# Patient Record
Sex: Male | Born: 1985 | Race: Black or African American | Hispanic: No | Marital: Married | Smoking: Current every day smoker
Health system: Southern US, Community
[De-identification: ages and names within clinical notes are randomized; demographics above are authoritative.]

---

## 2013-07-15 ENCOUNTER — Emergency Department (HOSPITAL_COMMUNITY): Payer: Medicaid Other

## 2013-07-15 ENCOUNTER — Encounter (HOSPITAL_COMMUNITY): Payer: Self-pay | Admitting: Emergency Medicine

## 2013-07-15 ENCOUNTER — Emergency Department (HOSPITAL_COMMUNITY)
Admission: EM | Admit: 2013-07-15 | Discharge: 2013-07-15 | Disposition: A | Discharge status: Home / Self Care | Payer: Medicaid Other | Attending: Emergency Medicine | Admitting: Emergency Medicine

## 2013-07-15 DIAGNOSIS — R002 Palpitations: Secondary | ICD-10-CM | POA: Diagnosis not present

## 2013-07-15 DIAGNOSIS — R001 Bradycardia, unspecified: Secondary | ICD-10-CM

## 2013-07-15 DIAGNOSIS — R42 Dizziness and giddiness: Secondary | ICD-10-CM | POA: Insufficient documentation

## 2013-07-15 DIAGNOSIS — R0789 Other chest pain: Secondary | ICD-10-CM

## 2013-07-15 DIAGNOSIS — I498 Other specified cardiac arrhythmias: Secondary | ICD-10-CM | POA: Insufficient documentation

## 2013-07-15 DIAGNOSIS — R059 Cough, unspecified: Secondary | ICD-10-CM | POA: Diagnosis not present

## 2013-07-15 DIAGNOSIS — R05 Cough: Secondary | ICD-10-CM | POA: Diagnosis not present

## 2013-07-15 DIAGNOSIS — R079 Chest pain, unspecified: Secondary | ICD-10-CM | POA: Diagnosis present

## 2013-07-15 DIAGNOSIS — F17200 Nicotine dependence, unspecified, uncomplicated: Secondary | ICD-10-CM | POA: Insufficient documentation

## 2013-07-15 DIAGNOSIS — R042 Hemoptysis: Secondary | ICD-10-CM | POA: Insufficient documentation

## 2013-07-15 LAB — BASIC METABOLIC PANEL
BUN: 10 mg/dL (ref 6–23)
CO2: 27 meq/L (ref 19–32)
Calcium: 9.5 mg/dL (ref 8.4–10.5)
Chloride: 100 mEq/L (ref 96–112)
Creatinine, Ser: 0.84 mg/dL (ref 0.50–1.35)
GFR calc Af Amer: >90 mL/min (ref >90)
GFR calc non Af Amer: >90 mL/min (ref >90)
Glucose, Bld: 99 mg/dL (ref 70–99)
Potassium: 3.8 mEq/L (ref 3.7–5.3)
SODIUM: 140 meq/L (ref 137–147)

## 2013-07-15 LAB — CBC
HCT: 38.6 % — ABNORMAL LOW (ref 39.0–52.0)
Hemoglobin: 13.6 g/dL (ref 13.0–17.0)
MCH: 30 pg (ref 26.0–34.0)
MCHC: 35.2 g/dL (ref 30.0–36.0)
MCV: 85.2 fL (ref 78.0–100.0)
PLATELETS: 257 10*3/uL (ref 150–400)
RBC: 4.53 MIL/uL (ref 4.22–5.81)
RDW: 12.9 % (ref 11.5–15.5)
WBC: 4.3 10*3/uL (ref 4.0–10.5)

## 2013-07-15 LAB — I-STAT TROPONIN, ED: TROPONIN I, POC: 0 ng/mL (ref 0.00–0.08)

## 2013-07-15 MED ORDER — NAPROXEN 500 MG PO TABS: 500.0000 mg | ORAL_TABLET | Freq: Two times a day (BID) | ORAL | Status: DC

## 2013-07-15 MED ORDER — AZITHROMYCIN 250 MG PO TABS: 250.0000 mg | ORAL_TABLET | Freq: Once | ORAL | Status: DC

## 2013-07-15 NOTE — ED Notes (Addendum)
Pt states he had two cups of coffee today. States for about a week his right side of chest will feel like it is racing and vibrating. Off and on. Chest pain 0/10. Denies pain. No acute distress noted. Sinus brady at 43 bpm. Pt was dizzy but is not at present.

## 2013-07-15 NOTE — ED Provider Notes (Signed)
CSN: [id number]     Arrival date & time 07/15/13  1939 History   First MD Initiated Contact with Patient 07/15/13 2107     Chief Complaint  Patient presents with  . Chest Pain   HPI  History provided by the patient and family. Patient is a healthy 28 year old male with no significant PMH presenting with symptoms of right chest discomfort, cough and slight lightheadedness. Patient states that he has had a long history since he was a young child of occasional heart palpitations and unusual fluttering in discomforts in his chest. He has never had any issues from this. He is never been evaluated for these symptoms. Yesterday he reports having some pains to his right chest area which lasted several hours. They did resolve on their own without any treatment. He does also report an associated increased productive cough for the past several weeks. He and the family report having issues with mold in the house which had taken out. Since that time he has had a productive cough occasionally with small streaks of blood. He states he has been using a lot of organic Garlick in his foods which has seemed to help reduce the amount of productive cough. He denies having any associated fever or chills with this cough. Coughing does sometimes make the right chest more uncomfortable. Today he also felt slightly lightheaded. Denies any other aggravating or alleviating factors. No other associated symptoms. No recent long travel. No pain or swelling in the extremities. No prior history of DVT or PE.     History reviewed. No pertinent past medical history. History reviewed. No pertinent past surgical history. History reviewed. No pertinent family history. History  Substance Use Topics  . Smoking status: Current Every Day Smoker    Types: Cigarettes  . Smokeless tobacco: Not on file  . Alcohol Use: Yes    Review of Systems  Constitutional: Negative for fever, chills and diaphoresis.  Respiratory: Positive for cough.    Cardiovascular: Positive for palpitations. Negative for chest pain and leg swelling.  Gastrointestinal: Negative for nausea, vomiting and abdominal pain.  Neurological: Positive for light-headedness. Negative for weakness, numbness and headaches.  All other systems reviewed and are negative.     Allergies  Review of patient's allergies indicates no known allergies.  Home Medications   Prior to Admission medications   Not on File   BP 137/64  Pulse 49  Temp(Src) 98.2 F (36.8 C) (Oral)  Resp 18  SpO2 100% Physical Exam  Nursing note and vitals reviewed. Constitutional: He is oriented to person, place, and time. He appears well-developed and well-nourished. No distress.  HENT:  Head: Normocephalic.  Mouth/Throat: Oropharynx is clear and moist.  Cardiovascular: Regular rhythm.  Bradycardia present.   No murmur heard. Pulmonary/Chest: Effort normal and breath sounds normal. No respiratory distress. He has no wheezes. He has no rales.  Abdominal: Soft. There is no tenderness. There is no rebound and no guarding.  Musculoskeletal: Normal range of motion. He exhibits no edema and no tenderness.  No clinical signs concerning for DVT  Neurological: He is alert and oriented to person, place, and time.  Skin: Skin is warm.  Psychiatric: He has a normal mood and affect. His behavior is normal.    ED Course  Procedures   COORDINATION OF CARE:  Nursing notes reviewed. Vital signs reviewed. Initial pt interview and examination performed.   Filed Vitals:   07/15/13 1951  BP: 137/64  Pulse: 49  Temp: 98.2 F (36.8 C)  TempSrc: Oral  Resp: 18  SpO2: 100%    9:39 PM-patient seen and evaluated. Patient resting appears well in no acute distress. Denies any complaints at this time. Does report long history of similar occasional funny sensation in heartbeats. Does have sinus bradycardia without any other significant findings. Laboratory testing unremarkable. There was slight  concerns for possible airway infection. Patient does report a history of recent mold problems in his house. Has had improvements of productive cough. Patient does not have any significant risk factors for ACS.  At this time I doubt any ACS. Doubt PE. Given his bradycardia symptoms we'll plan to give cardiology referral for further evaluation. We'll also plan to give Z-Pak for questionable old airway infection. Strictures from precautions given. Patient agrees with plan.  Results for orders placed during the hospital encounter of 07/15/13  CBC      Result Value Ref Range   WBC 4.3  4.0 - 10.5 K/uL   RBC 4.53  4.22 - 5.81 MIL/uL   Hemoglobin 13.6  13.0 - 17.0 g/dL   HCT 40.938.6 (*) 81.139.0 - 91.452.0 %   MCV 85.2  78.0 - 100.0 fL   MCH 30.0  26.0 - 34.0 pg   MCHC 35.2  30.0 - 36.0 g/dL   RDW 78.212.9  95.611.5 - 21.315.5 %   Platelets 257  150 - 400 K/uL  BASIC METABOLIC PANEL      Result Value Ref Range   Sodium 140  137 - 147 mEq/L   Potassium 3.8  3.7 - 5.3 mEq/L   Chloride 100  96 - 112 mEq/L   CO2 27  19 - 32 mEq/L   Glucose, Bld 99  70 - 99 mg/dL   BUN 10  6 - 23 mg/dL   Creatinine, Ser 0.860.84  0.50 - 1.35 mg/dL   Calcium 9.5  8.4 - 57.810.5 mg/dL   GFR calc non Af Amer >90  >90 mL/min   GFR calc Af Amer >90  >90 mL/min  I-STAT TROPOININ, ED      Result Value Ref Range   Troponin i, poc 0.00  0.00 - 0.08 ng/mL   Comment 3              Imaging Review Dg Chest 2 View  07/15/2013   CLINICAL DATA:  28 year old male with right upper chest pain, dizziness, spasm. Brown sputum. Initial encounter.  EXAM: CHEST  2 VIEW  COMPARISON:  None.  FINDINGS: Normal lung volumes. Normal cardiac size and mediastinal contours. Visualized tracheal air column is within normal limits. No pneumothorax or pleural effusion. No pulmonary edema or confluent pulmonary opacity. Mild upper lobe predominant increased interstitial markings. No acute osseous abnormality identified.  IMPRESSION: Negative except for mild upper lobe  predominant increased interstitial markings. Acute viral/ atypical respiratory infection not excluded.   Electronically Signed   By: Augusto GambleLee  Hall M.D.   On: 07/15/2013 20:46    Date: 07/15/2013  Rate: 52  Rhythm: sinus bradycardia and sinus arrhythmia  QRS Axis: normal  Intervals: normal  ST/T Wave abnormalities: nonspecific ST changes  Conduction Disutrbances:none  Narrative Interpretation:   Old EKG Reviewed: none available      MDM   Final diagnoses:  Cough  Atypical chest pain  Bradycardia         Angus Sellereter S Dammen, PA-C 07/15/13 2150

## 2013-07-15 NOTE — ED Notes (Signed)
Presents with right sided chest pain that began last night associate with dizziness. Reports one week of a funny feeling in right side of chest "my heart will speed up real fast and then slow down and it feels funny." chest pain radiates from right side into center of chest associateed ewith dizziness. Pain comes and goes. Was so severe yesterday he had to leave work.

## 2013-07-15 NOTE — Discharge Instructions (Signed)
Please followup with a primary care provider and a cardiology specialist for continued evaluation and treatment of your symptoms and to evaluate your slow heart beat. Return at any time for changing or worsening symptoms.    Bradycardia Bradycardia is a term for a heart rate (pulse) that, in adults, is slower than 60 beats per minute. A normal rate is 60 to 100 beats per minute. A heart rate below 60 beats per minute may be normal for some adults with healthy hearts. If the rate is too slow, the heart may have trouble pumping the volume of blood the body needs. If the heart rate gets too low, blood flow to the brain may be decreased and may make you feel lightheaded, dizzy, or faint. The heart has a natural pacemaker in the top of the heart called the SA node (sinoatrial or sinus node). This pacemaker sends out regular electrical signals to the muscle of the heart, telling the heart muscle when to beat (contract). The electrical signal travels from the upper parts of the heart (atria) through the AV node (atrioventricular node), to the lower chambers of the heart (ventricles). The ventricles squeeze, pumping the blood from your heart to your lungs and to the rest of your body. CAUSES   Problem with the heart's electrical system.  Problem with the heart's natural pacemaker.  Heart disease, damage, or infection.  Medications.  Problems with minerals and salts (electrolytes). SYMPTOMS   Fainting (syncope).  Fatigue and weakness.  Shortness of breath (dyspnea).  Chest pain (angina).  Drowsiness.  Confusion. DIAGNOSIS   An electrocardiogram (ECG) can help your caregiver determine the type of slow heart rate you have.  If the cause is not seen on an ECG, you may need to wear a heart monitor that records your heart rhythm for several hours or days.  Blood tests. TREATMENT   Electrolyte supplements.  Medications.  Withholding medication which is causing a slow heart  rate.  Pacemaker placement. SEEK IMMEDIATE MEDICAL CARE IF:   You feel lightheaded or faint.  You develop an irregular heart rate.  You feel chest pain or have trouble breathing. MAKE SURE YOU:   Understand these instructions.  Will watch your condition.  Will get help right away if you are not doing well or get worse. Document Released: 09/28/2001 Document Revised: 03/31/2011 Document Reviewed: 08/25/2007 [facility] Patient Information 2015 [place], [place]. This information is not intended to replace advice given to you by your health care provider. Make sure you discuss any questions you have with your health care provider.    Cough, Adult  A cough is a reflex. It helps you clear your throat and airways. A cough can help heal your body. A cough can last 2 or 3 weeks (acute) or may last more than 8 weeks (chronic). Some common causes of a cough can include an infection, allergy, or a cold. HOME CARE  Only take medicine as told by your doctor.  If given, take your medicines (antibiotics) as told. Finish them even if you start to feel better.  Use a cold steam vaporizer or humidier in your home. This can help loosen thick spit (secretions).  Sleep so you are almost sitting up (semi-upright). Use pillows to do this. This helps reduce coughing.  Rest as needed.  Stop smoking if you smoke. GET HELP RIGHT AWAY IF:  You have yellowish-white fluid (pus) in your thick spit.  Your cough gets worse.  Your medicine does not reduce coughing, and you are  losing sleep.  You cough up blood.  You have trouble breathing.  Your pain gets worse and medicine does not help.  You have a fever. MAKE SURE YOU:   Understand these instructions.  Will watch your condition.  Will get help right away if you are not doing well or get worse. Document Released: 09/19/2010 Document Revised: 03/31/2011 Document Reviewed: 09/19/2010 [facility] Patient Information 2015 [place], [place]. This  information is not intended to replace advice given to you by your health care provider. Make sure you discuss any questions you have with your health care provider.

## 2013-07-16 NOTE — ED Provider Notes (Signed)
Medical screening examination/treatment/procedure(s) were performed by non-physician practitioner and as supervising physician I was immediately available for consultation/collaboration.   EKG Interpretation None        David H Yao, MD 07/16/13 1121 

## 2018-07-30 ENCOUNTER — Other Ambulatory Visit: Payer: Self-pay

## 2018-07-30 ENCOUNTER — Ambulatory Visit (HOSPITAL_COMMUNITY)
Admission: EM | Admit: 2018-07-30 | Discharge: 2018-07-30 | Disposition: A | Discharge status: Home / Self Care | Payer: PRIVATE HEALTH INSURANCE | Attending: Family Medicine | Admitting: Family Medicine

## 2018-07-30 ENCOUNTER — Encounter (HOSPITAL_COMMUNITY): Payer: Self-pay

## 2018-07-30 DIAGNOSIS — M25512 Pain in left shoulder: Secondary | ICD-10-CM

## 2018-07-30 MED ORDER — NAPROXEN 500 MG PO TABS: 500.0000 mg | ORAL_TABLET | Freq: Two times a day (BID) | ORAL | 0 refills | Status: AC

## 2018-07-30 MED ORDER — TIZANIDINE HCL 4 MG PO TABS: 4.0000 mg | ORAL_TABLET | Freq: Four times a day (QID) | ORAL | 0 refills | Status: AC | PRN

## 2018-07-30 NOTE — ED Provider Notes (Signed)
[facility]    CSN: [id number] Arrival date & time: 07/30/18  9379      History   Chief Complaint Chief Complaint  Patient presents with  . Shoulder Pain    HPI Zachary Mcgrath is a 33 y.o. male.   HPI  Patient states that he woke up yesterday with a pain in his back.  He thinks he slept wrong.  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  Today he woke up and felt he was not able to work.  The pain is in the neck on the left side, and up by the shoulder blade.  No pain in the shoulder joint.  No radiation of the arms, no numbness or weakness.  No injury.  No trauma.  No fall.  No prior neck problems. Patient states he took Naprosyn for prior orthopedic problem that worked well for him.  Is requesting Naprosyn  History reviewed. No pertinent past medical history.  There are no active problems to display for this patient.   History reviewed. No pertinent surgical history.     Home Medications    Prior to Admission medications   Medication Sig Start Date End Date Taking? Authorizing Provider  naproxen (NAPROSYN) 500 MG tablet Take 1 tablet (500 mg total) by mouth 2 (two) times daily with a meal. 07/30/18   Raylene Everts, MD  tiZANidine (ZANAFLEX) 4 MG tablet Take 1-2 tablets (4-8 mg total) by mouth every 6 (six) hours as needed for muscle spasms. 07/30/18   Raylene Everts, MD    Family History Family History  Family history unknown: Yes    Social History Social History   Tobacco Use  . Smoking status: Current Every Day Smoker    Types: Cigarettes  Substance Use Topics  . Alcohol use: Yes  . Drug use: No     Allergies   Patient has no known allergies.   Review of Systems Review of Systems  Constitutional: Negative for chills and fever.  HENT: Negative for ear pain and sore throat.   Eyes: Negative for pain and visual disturbance.  Respiratory: Negative for cough and shortness of breath.    Cardiovascular: Negative for chest pain and palpitations.  Gastrointestinal: Negative for abdominal pain and vomiting.  Genitourinary: Negative for dysuria and hematuria.  Musculoskeletal: Positive for neck pain and neck stiffness. Negative for arthralgias and back pain.  Skin: Negative for color change and rash.  Neurological: Negative for seizures and syncope.  All other systems reviewed and are negative.    Physical Exam Triage Vital Signs ED Triage Vitals  Enc Vitals Group     BP 07/30/18 0905 (!) 157/84     Pulse Rate 07/30/18 0905 (!) 58     Resp 07/30/18 0905 18     Temp 07/30/18 0905 99.6 F (37.6 C)     Temp Source 07/30/18 0905 Oral     SpO2 07/30/18 0905 100 %     Weight --      Height --      Head Circumference --      Peak Flow --      Pain Score 07/30/18 0906 9     Pain Loc --      Pain Edu? --      Excl. in [place]? --    No data found.  Updated Vital Signs BP (!) 157/84 (BP Location: Left Arm)   Pulse (!) 58  Temp 99.6 F (37.6 C) (Oral)   Resp 18   SpO2 100%   Visual Acuity Right Eye Distance:   Left Eye Distance:   Bilateral Distance:    Right Eye Near:   Left Eye Near:    Bilateral Near:     Physical Exam Constitutional:      General: He is not in acute distress.    Appearance: He is well-developed.  HENT:     Head: Normocephalic and atraumatic.  Eyes:     Conjunctiva/sclera: Conjunctivae normal.     Pupils: Pupils are equal, round, and reactive to light.  Neck:     Musculoskeletal: Normal range of motion.  Cardiovascular:     Rate and Rhythm: Normal rate.  Pulmonary:     Effort: Pulmonary effort is normal. No respiratory distress.  Abdominal:     General: There is no distension.     Palpations: Abdomen is soft.  Musculoskeletal: Normal range of motion.     Comments: Tenderness in the left upper body trapezius down the medial border of the scapula.  Pain with neck range of motion and shoulder shrug.  Skin:    General: Skin is warm  and dry.  Neurological:     General: No focal deficit present.     Mental Status: He is alert.     Sensory: No sensory deficit.     Motor: No weakness.     Coordination: Coordination normal.     Gait: Gait normal.     Deep Tendon Reflexes: Reflexes normal.  Psychiatric:        Mood and Affect: Mood normal.      UC Treatments / Results  Labs (all labs ordered are listed, but only abnormal results are displayed) Labs Reviewed - No data to display  EKG   Radiology No results found.  Procedures Procedures (including critical care time)  Medications Ordered in UC Medications - No data to display  Initial Impression / Assessment and Plan / UC Course  I have reviewed the triage vital signs and the nursing notes.  Pertinent labs & imaging results that were available during my care of the patient were reviewed by me and considered in my medical decision making (see chart for details).     Muscular neck pain.  Discussed Final Clinical Impressions(s) / UC Diagnoses   Final diagnoses:  Acute pain of left shoulder     Discharge Instructions     Ice or heat to painful muscles Rest Take medication as prescribed Call if not improving in a few days   ED Prescriptions    Medication Sig Dispense Auth. Provider   naproxen (NAPROSYN) 500 MG tablet Take 1 tablet (500 mg total) by mouth 2 (two) times daily with a meal. 30 tablet Eustace MooreNelson, Cia Garretson Sue, MD   tiZANidine (ZANAFLEX) 4 MG tablet Take 1-2 tablets (4-8 mg total) by mouth every 6 (six) hours as needed for muscle spasms. 21 tablet Eustace MooreNelson, Carlesha Seiple Sue, MD     Controlled Substance Prescriptions [place] Controlled Substance Registry consulted? Not Applicable   Eustace MooreNelson, Kelley Polinsky Sue, MD 07/30/18 1023

## 2018-07-30 NOTE — ED Triage Notes (Signed)
Pt presents with left shoulder and upper back pain by shoulder X 2 days.

## 2018-07-30 NOTE — Discharge Instructions (Signed)
Ice or heat to painful muscles Rest Take medication as prescribed Call if not improving in a few days

## 2018-12-15 ENCOUNTER — Inpatient Hospital Stay (HOSPITAL_COMMUNITY)
Admission: EM | Admit: 2018-12-15 | Discharge: 2018-12-16 | DRG: 343 | Disposition: A | Discharge status: Home / Self Care | Payer: PRIVATE HEALTH INSURANCE | Attending: Surgery | Admitting: Surgery

## 2018-12-15 ENCOUNTER — Other Ambulatory Visit: Payer: Self-pay

## 2018-12-15 ENCOUNTER — Ambulatory Visit (HOSPITAL_COMMUNITY)
Admission: EM | Admit: 2018-12-15 | Discharge: 2018-12-15 | Disposition: A | Discharge status: Home / Self Care | Payer: PRIVATE HEALTH INSURANCE | Source: Home / Self Care

## 2018-12-15 ENCOUNTER — Emergency Department (HOSPITAL_COMMUNITY): Payer: PRIVATE HEALTH INSURANCE

## 2018-12-15 ENCOUNTER — Encounter (HOSPITAL_COMMUNITY): Payer: Self-pay | Admitting: Emergency Medicine

## 2018-12-15 ENCOUNTER — Encounter (HOSPITAL_COMMUNITY): Payer: Self-pay

## 2018-12-15 DIAGNOSIS — Z20828 Contact with and (suspected) exposure to other viral communicable diseases: Secondary | ICD-10-CM | POA: Diagnosis present

## 2018-12-15 DIAGNOSIS — Z79899 Other long term (current) drug therapy: Secondary | ICD-10-CM | POA: Diagnosis not present

## 2018-12-15 DIAGNOSIS — K3580 Unspecified acute appendicitis: Principal | ICD-10-CM | POA: Diagnosis present

## 2018-12-15 DIAGNOSIS — F17210 Nicotine dependence, cigarettes, uncomplicated: Secondary | ICD-10-CM | POA: Diagnosis present

## 2018-12-15 LAB — URINALYSIS, ROUTINE W REFLEX MICROSCOPIC
Bilirubin Urine: NEGATIVE
Glucose, UA: NEGATIVE mg/dL
Hgb urine dipstick: NEGATIVE
Ketones, ur: NEGATIVE mg/dL
Leukocytes,Ua: NEGATIVE
Nitrite: NEGATIVE
Protein, ur: NEGATIVE mg/dL
Specific Gravity, Urine: 1.014 (ref 1.005–1.030)
pH: 6 (ref 5.0–8.0)

## 2018-12-15 LAB — COMPREHENSIVE METABOLIC PANEL
ALT: 30 U/L (ref 0–44)
AST: 39 U/L (ref 15–41)
Albumin: 4.8 g/dL (ref 3.5–5.0)
Alkaline Phosphatase: 62 U/L (ref 38–126)
Anion gap: 14 (ref 5–15)
BUN: 9 mg/dL (ref 6–20)
CO2: 22 mmol/L (ref 22–32)
Calcium: 9.6 mg/dL (ref 8.9–10.3)
Chloride: 101 mmol/L (ref 98–111)
Creatinine, Ser: 1 mg/dL (ref 0.61–1.24)
GFR calc Af Amer: >60 mL/min (ref >60)
GFR calc non Af Amer: >60 mL/min (ref >60)
Glucose, Bld: 94 mg/dL (ref 70–99)
Potassium: 4.2 mmol/L (ref 3.5–5.1)
Sodium: 137 mmol/L (ref 135–145)
Total Bilirubin: 1.7 mg/dL — ABNORMAL HIGH (ref 0.3–1.2)
Total Protein: 8.1 g/dL (ref 6.5–8.1)

## 2018-12-15 LAB — CBC
HCT: 42.5 % (ref 39.0–52.0)
Hemoglobin: 14.7 g/dL (ref 13.0–17.0)
MCH: 29.6 pg (ref 26.0–34.0)
MCHC: 34.6 g/dL (ref 30.0–36.0)
MCV: 85.5 fL (ref 80.0–100.0)
Platelets: 261 10*3/uL (ref 150–400)
RBC: 4.97 MIL/uL (ref 4.22–5.81)
RDW: 12.8 % (ref 11.5–15.5)
WBC: 9.4 10*3/uL (ref 4.0–10.5)
nRBC: 0 % (ref 0.0–0.2)

## 2018-12-15 LAB — LIPASE, BLOOD: Lipase: 28 U/L (ref 11–51)

## 2018-12-15 LAB — SARS CORONAVIRUS 2 BY RT PCR (HOSPITAL ORDER, PERFORMED IN ~~LOC~~ HOSPITAL LAB): SARS Coronavirus 2: NEGATIVE

## 2018-12-15 MED ORDER — ONDANSETRON 4 MG PO TBDP: 4.0000 mg | ORAL_TABLET | Freq: Four times a day (QID) | ORAL | Status: DC | PRN

## 2018-12-15 MED ORDER — SODIUM CHLORIDE 0.9 % IV SOLN
2.0000 g | Freq: Once | INTRAVENOUS | Status: AC
  Administered 2018-12-15: 2 g via INTRAVENOUS
  Filled 2018-12-15: qty 20

## 2018-12-15 MED ORDER — HYDROMORPHONE HCL 1 MG/ML IJ SOLN: 0.5000 mg | INTRAMUSCULAR | Status: DC | PRN

## 2018-12-15 MED ORDER — ONDANSETRON HCL 4 MG/2ML IJ SOLN: 4.0000 mg | Freq: Four times a day (QID) | INTRAMUSCULAR | Status: DC | PRN

## 2018-12-15 MED ORDER — IOHEXOL 300 MG/ML  SOLN
100.0000 mL | Freq: Once | INTRAMUSCULAR | Status: AC | PRN
  Administered 2018-12-15: 19:00:00 100 mL via INTRAVENOUS

## 2018-12-15 MED ORDER — PIPERACILLIN-TAZOBACTAM 3.375 G IVPB
3.3750 g | Freq: Three times a day (TID) | INTRAVENOUS | Status: DC
  Administered 2018-12-16 (×2): 3.375 g via INTRAVENOUS
  Filled 2018-12-15 (×3): qty 50

## 2018-12-15 MED ORDER — TRAMADOL HCL 50 MG PO TABS: 50.0000 mg | ORAL_TABLET | Freq: Four times a day (QID) | ORAL | Status: DC | PRN

## 2018-12-15 MED ORDER — SODIUM CHLORIDE 0.9% FLUSH: 3.0000 mL | Freq: Once | INTRAVENOUS | Status: DC

## 2018-12-15 MED ORDER — SODIUM CHLORIDE 0.9 % IV SOLN
INTRAVENOUS | Status: DC | PRN
  Administered 2018-12-15: 21:00:00 via INTRAVENOUS

## 2018-12-15 MED ORDER — METRONIDAZOLE IN NACL 5-0.79 MG/ML-% IV SOLN
500.0000 mg | Freq: Once | INTRAVENOUS | Status: AC
  Administered 2018-12-15: 500 mg via INTRAVENOUS
  Filled 2018-12-15: qty 100

## 2018-12-15 MED ORDER — DIPHENHYDRAMINE HCL 12.5 MG/5ML PO ELIX: 12.5000 mg | ORAL_SOLUTION | Freq: Four times a day (QID) | ORAL | Status: DC | PRN

## 2018-12-15 MED ORDER — ACETAMINOPHEN 500 MG PO TABS
1000.0000 mg | ORAL_TABLET | Freq: Four times a day (QID) | ORAL | Status: DC
  Administered 2018-12-16 (×2): 1000 mg via ORAL
  Filled 2018-12-15 (×2): qty 2

## 2018-12-15 MED ORDER — DOCUSATE SODIUM 100 MG PO CAPS
200.0000 mg | ORAL_CAPSULE | Freq: Two times a day (BID) | ORAL | Status: DC
  Administered 2018-12-16: 200 mg via ORAL
  Filled 2018-12-15 (×2): qty 2

## 2018-12-15 MED ORDER — LACTATED RINGERS IV SOLN
INTRAVENOUS | Status: DC
  Administered 2018-12-16: 13:00:00 via INTRAVENOUS

## 2018-12-15 MED ORDER — DIPHENHYDRAMINE HCL 50 MG/ML IJ SOLN: 12.5000 mg | Freq: Four times a day (QID) | INTRAMUSCULAR | Status: DC | PRN

## 2018-12-15 MED ORDER — SIMETHICONE 80 MG PO CHEW: 40.0000 mg | CHEWABLE_TABLET | Freq: Four times a day (QID) | ORAL | Status: DC | PRN

## 2018-12-15 MED ORDER — IBUPROFEN 600 MG PO TABS: 600.0000 mg | ORAL_TABLET | Freq: Four times a day (QID) | ORAL | Status: DC | PRN

## 2018-12-15 MED ORDER — HEPARIN SODIUM (PORCINE) 5000 UNIT/ML IJ SOLN
5000.0000 [IU] | Freq: Three times a day (TID) | INTRAMUSCULAR | Status: DC
  Administered 2018-12-16 (×2): 5000 [IU] via SUBCUTANEOUS
  Filled 2018-12-15 (×2): qty 1

## 2018-12-15 NOTE — ED Provider Notes (Signed)
[facility] EMERGENCY DEPARTMENT Provider Note   CSN: [id number] Arrival date & time: 12/15/18  1703     History   Chief Complaint Chief Complaint  Patient presents with  . Abdominal Pain    HPI Sylvain Marca Anconaeeler is a 33 y.o. male with no significant past medical history who presents to the ED for an evaluation of right lower quadrant pain. Patient was previously seen at urgent care and sent here to rule out an appendicitis.  Patient notes he started having right lower quadrant pain this morning.  Patient admits to eating cheesy fries and drinking numerous beers last night which he believes caused the abdominal pain. Upon initial assessment at urgent care, patient had a low-grade fever of 100.2.  Patient denies associated symptoms of nausea, vomiting, and diarrhea.  Patient denies previous abdominal surgeries.  Patient notes his pain this morning was roughly a 2/10. Patient denies GU symptoms.  Patient denies shortness of breath, chest pain, and back pain.   History reviewed. No pertinent past medical history.  Patient Active Problem List   Diagnosis Date Noted  . Acute appendicitis 12/15/2018    History reviewed. No pertinent surgical history.      Home Medications    Prior to Admission medications   Medication Sig Start Date End Date Taking? Authorizing Provider  naproxen (NAPROSYN) 500 MG tablet Take 1 tablet (500 mg total) by mouth 2 (two) times daily with a meal. Patient not taking: Reported on 12/15/2018 07/30/18   Eustace MooreNelson, Yvonne Sue, MD  tiZANidine (ZANAFLEX) 4 MG tablet Take 1-2 tablets (4-8 mg total) by mouth every 6 (six) hours as needed for muscle spasms. Patient not taking: Reported on 12/15/2018 07/30/18   Eustace MooreNelson, Yvonne Sue, MD    Family History Family History  Problem Relation Age of Onset  . Healthy Mother     Social History Social History   Tobacco Use  . Smoking status: Current Every Day Smoker    Types: Cigarettes  Substance Use  Topics  . Alcohol use: Yes  . Drug use: No     Allergies   Patient has no known allergies.   Review of Systems Review of Systems  Constitutional: Negative for chills and fever.  Respiratory: Negative for shortness of breath.   Cardiovascular: Negative for chest pain.  Gastrointestinal: Positive for abdominal pain. Negative for abdominal distention, diarrhea, nausea and vomiting.  Genitourinary: Negative for dysuria and flank pain.  Musculoskeletal: Negative for back pain.  All other systems reviewed and are negative.    Physical Exam Updated Vital Signs BP (!) 171/93 (BP Location: Right Arm)   Pulse 94   Temp 99.5 F (37.5 C) (Oral)   Resp 16   Ht 6\' 2"  (1.88 m)   Wt 104.3 kg   SpO2 100%   BMI 29.53 kg/m   Physical Exam Vitals signs and nursing note reviewed.  Constitutional:      General: He is not in acute distress.    Appearance: He is not ill-appearing.  HENT:     Head: Normocephalic.  Eyes:     Pupils: Pupils are equal, round, and reactive to light.  Neck:     Musculoskeletal: Neck supple.  Cardiovascular:     Rate and Rhythm: Normal rate and regular rhythm.     Pulses: Normal pulses.     Heart sounds: Normal heart sounds. No murmur. No friction rub. No gallop.   Pulmonary:     Effort: Pulmonary effort is normal.  Breath sounds: Normal breath sounds.  Abdominal:     General: Abdomen is flat. Bowel sounds are normal. There is no distension.     Palpations: Abdomen is soft.     Tenderness: There is abdominal tenderness. There is guarding.     Comments: Tenderness to palpation in RLQ with guarding. No rebound. Positive McBurney's point tenderness.   Musculoskeletal:     Comments: Able to move all 4 extremities without difficulty.  Skin:    General: Skin is warm and dry.  Neurological:     General: No focal deficit present.     Mental Status: He is alert.      ED Treatments / Results  Labs (all labs ordered are listed, but only abnormal  results are displayed) Labs Reviewed  COMPREHENSIVE METABOLIC PANEL - Abnormal; Notable for the following components:      Result Value   Total Bilirubin 1.7 (*)    All other components within normal limits  SARS CORONAVIRUS 2 BY RT PCR (HOSPITAL ORDER, PERFORMED IN [facility])  LIPASE, BLOOD  CBC  URINALYSIS, ROUTINE W REFLEX MICROSCOPIC  HIV ANTIBODY (ROUTINE TESTING W REFLEX)  CBC    EKG None  Radiology Ct Abdomen Pelvis W Contrast  Result Date: 12/15/2018 CLINICAL DATA:  Right lower quadrant abdominal pain starting this morning. EXAM: CT ABDOMEN AND PELVIS WITH CONTRAST TECHNIQUE: Multidetector CT imaging of the abdomen and pelvis was performed using the standard protocol following bolus administration of intravenous contrast. CONTRAST:  OMNIPAQUE IOHEXOL 300 MG/ML  SOLN COMPARISON:  None. FINDINGS: Lower chest: Unremarkable Hepatobiliary: Unremarkable Pancreas: Unremarkable Spleen: Unremarkable Adrenals/Urinary Tract: Unremarkable Stomach/Bowel: There is abnormal wall thickening of the appendix up to 1.2 cm in diameter, with surrounding periappendiceal stranding, compatible with acute appendicitis. No extraluminal gas or abscess. Vascular/Lymphatic: Unremarkable Reproductive: Unremarkable Other: No supplemental non-categorized findings. Musculoskeletal: Unremarkable IMPRESSION: Acute nonruptured appendicitis. No abscess or extraluminal gas. Electronically Signed   By: Gaylyn Rong M.D.   On: 12/15/2018 19:29    Procedures Procedures (including critical care time)  Medications Ordered in ED Medications  heparin injection 5,000 Units (5,000 Units Subcutaneous Given 12/16/18 0006)  lactated ringers infusion (has no administration in time range)  piperacillin-tazobactam (ZOSYN) IVPB 3.375 g (3.375 g Intravenous New Bag/Given 12/16/18 0008)  diphenhydrAMINE (BENADRYL) 12.5 MG/5ML elixir 12.5 mg (has no administration in time range)    Or  diphenhydrAMINE  (BENADRYL) injection 12.5 mg (has no administration in time range)  acetaminophen (TYLENOL) tablet 1,000 mg (1,000 mg Oral Given 12/16/18 0005)  ibuprofen (ADVIL) tablet 600 mg (has no administration in time range)  traMADol (ULTRAM) tablet 50 mg (has no administration in time range)  ondansetron (ZOFRAN-ODT) disintegrating tablet 4 mg (has no administration in time range)    Or  ondansetron (ZOFRAN) injection 4 mg (has no administration in time range)  HYDROmorphone (DILAUDID) injection 0.5 mg (has no administration in time range)  simethicone (MYLICON) chewable tablet 40 mg (has no administration in time range)  docusate sodium (COLACE) capsule 200 mg (200 mg Oral Given 12/16/18 0006)  iohexol (OMNIPAQUE) 300 MG/ML solution 100 mL (100 mLs Intravenous Contrast Given 12/15/18 1905)  cefTRIAXone (ROCEPHIN) 2 g in sodium chloride 0.9 % 100 mL IVPB (0 g Intravenous Stopped 12/15/18 2228)    And  metroNIDAZOLE (FLAGYL) IVPB 500 mg (0 mg Intravenous Stopped 12/15/18 2228)     Initial Impression / Assessment and Plan / ED Course  I have reviewed the triage vital signs and the nursing  notes.  Pertinent labs & imaging results that were available during my care of the patient were reviewed by me and considered in my medical decision making (see chart for details).  Clinical Course as of Dec 15 25  Wed Dec 15, 2018  1949 Spoke to Dr. Dema Severin who agrees to evaluate patient and admit for acute appendicitis.    [CC]  2105 Spoke to Dr. Dema Severin to tell him patient now agrees to have the surgery. Will start IV abx and send for COVID swab.   [CC]    Clinical Course User Index [CC] Jonette Eva, PA-C      33 year old male presents to the ED from urgent care due to right lower quadrant pain to rule out appendicitis. Upon arrival to urgent care patient had a low-grade fever of 100.5.  Patient in no acute distress and non-toxic-appearing.  Abdomen is soft, nondistended with tenderness to palpation  in the right lower quadrant with guarding.  Negative CVA tenderness bilaterally. Patient denies GU symptoms. Will obtain CT to rule out appendicitis and other abdominal etiologies.   CBC unremarkable.  CMP unremarkable except for increased total bilirubin of 1.7.  UA without signs of infection.  CT personally reviewed which demonstrates an acute nonperforated appendicitis.  Will start patient on Rocephin and Flagyl.  General surgery consulted.  Patient's last meal was roughly around 4 PM.  See note above in regards to consults. Patient continuously flipped back and forth about leaving AMA. Patient initial told Dr. Dema Severin he does not want the surgery and does not want to be admitted for IV abx. I spoke to the patient numerous times at length to discuss the risks of leaving AMA. Patient finally agreed to stay, Dr. Dema Severin paged again. Patient will be admitted for either surgery or IV abx.  Final Clinical Impressions(s) / ED Diagnoses   Final diagnoses:  Acute appendicitis, unspecified acute appendicitis type    ED Discharge Orders    None       Romie Levee 12/16/18 4008    Wyvonnia Dusky, MD 12/16/18 1058

## 2018-12-15 NOTE — ED Notes (Signed)
Surgeon is at bedside to answer patient's questions regarding procedure-Monique,RN

## 2018-12-15 NOTE — ED Triage Notes (Addendum)
Noticed abdominal pain this morning after working last night. Patient touches right lower quadrant as location of pain. No vomiting or diarrhea.  Patient thinks this is food poisoning.  Patient is having slight chills.

## 2018-12-15 NOTE — ED Notes (Signed)
Patient transported to CT 

## 2018-12-15 NOTE — Progress Notes (Signed)
I was called back by ED that he was interested in staying for treatment  I came back by to see and evaluate him. We had a long discussion again about treatment of appendicitis.  -The anatomy and physiology of the GI tract was discussed at length with the patient and his wife. The pathophysiology of appendicitis was discussed at length as well. -I discussed options moving forward for treatment. We advocated for surgery as this is the most definitive option and nonoperative manamgent carries risk of recurrence. He continued to discuss that he did not want any surgery for this. We discussed risks of recurrence at 39yrs being as high as 40% in some studies (APPAC trial in cases of acute uncomplicated appendicitis - exclusions being perforation, abscess, suspected malignancy, appenddicolith, age <18 or >60, peritonitis, serious comorbidities). We discussed surgical procedure including laparoscopic and potential open techniques. We discussed the material risks (including, but not limited to, pain, bleeding, infection, scarring, need for blood transfusion, damage to surrounding structures- blood vessels/nerves/viscus/organs, damage to ureter/bladder, urine leak, leak from staple line, need for additional procedures, hernia, recurrence although MUCH lower than without surgery, pneumonia, heart attack, stroke, death) benefits and alternatives to surgery were discussed at length. The patient's questions were answered to his satisfaction, he voiced understanding. Additionally, we discussed typical postoperative expectations and the recovery process. -He continues to wish to proceed with antibiotics alone - he is willing to be admitted to the hospital for IV abx. We discussed that with this route, he would be here likely for at least 72 hours for IV antibiotics (vs potentially going home tomorrow if he had surgery tonight). He expressed understanding and has opted to proceed with this.  Nadeen Landau, M.D. [facility]

## 2018-12-15 NOTE — ED Notes (Signed)
RN spoke with patient and spouse regarding decision to continue tx or leave; Patient is ok with having surgery but would like EDP or surgeon to explain the process of putting him to sleep; PA notified-Monique,RN

## 2018-12-15 NOTE — ED Notes (Signed)
ED TO INPATIENT HANDOFF REPORT  ED Nurse Name and Phone #: Gwyndolyn Saxon [phone]  S Name/Age/Gender Zachary Mcgrath 33 y.o. male Room/Bed: 010C/010C  Code Status   Code Status: Not on file  Home/SNF/Other Home Patient oriented to: self, place, time and situation Is this baseline? Yes   Triage Complete: Triage complete  Chief Complaint ABD PAIN  Triage Note Pt reports RLQ pain since this morning, denies n/v/d. Pt a.o, nad noted   Allergies No Known Allergies  Level of Care/Admitting Diagnosis ED Disposition    ED Disposition Condition Comment   Admit  Hospital Area: [facility] [[id number]]  Level of Care: Med-Surg [16]  Covid Evaluation: Asymptomatic Screening Protocol (No Symptoms)  Diagnosis: Acute appendicitis [419622]  Admitting Physician: CCS, Bearden  Attending Physician: CCS, MD [[id number]]  Estimated length of stay: 3 - 4 days  Certification:: I certify this patient will need inpatient services for at least 2 midnights  PT Class (Do Not Modify): Inpatient [101]  PT Acc Code (Do Not Modify): Private [1]       B Medical/Surgery History History reviewed. No pertinent past medical history. History reviewed. No pertinent surgical history.   A IV Location/Drains/Wounds Patient Lines/Drains/Airways Status   Active Line/Drains/Airways    Name:   Placement date:   Placement time:   Site:   Days:   Peripheral IV 12/15/18 Right Antecubital   12/15/18    1850    Antecubital   less than 1          Intake/Output Last 24 hours  Intake/Output Summary (Last 24 hours) at 12/15/2018 2308 Last data filed at 12/15/2018 2228 Gross per 24 hour  Intake 200.42 ml  Output -  Net 200.42 ml    Labs/Imaging Results for orders placed or performed during the hospital encounter of 12/15/18 (from the past 48 hour(s))  Urinalysis, Routine w reflex microscopic     Status: None   Collection Time: 12/15/18  5:12 PM  Result Value Ref Range   Color, Urine  YELLOW YELLOW   APPearance CLEAR CLEAR   Specific Gravity, Urine 1.014 1.005 - 1.030   pH 6.0 5.0 - 8.0   Glucose, UA NEGATIVE NEGATIVE mg/dL   Hgb urine dipstick NEGATIVE NEGATIVE   Bilirubin Urine NEGATIVE NEGATIVE   Ketones, ur NEGATIVE NEGATIVE mg/dL   Protein, ur NEGATIVE NEGATIVE mg/dL   Nitrite NEGATIVE NEGATIVE   Leukocytes,Ua NEGATIVE NEGATIVE    Comment: Performed at [place] [address] [place], [place] [address]  Lipase, blood     Status: None   Collection Time: 12/15/18  5:26 PM  Result Value Ref Range   Lipase 28 11 - 51 U/L    Comment: Performed at [facility], [place] [address], [place], [place] [address]  Comprehensive metabolic panel     Status: Abnormal   Collection Time: 12/15/18  5:26 PM  Result Value Ref Range   Sodium 137 135 - 145 mmol/L   Potassium 4.2 3.5 - 5.1 mmol/L    Comment: SLIGHT HEMOLYSIS   Chloride 101 98 - 111 mmol/L   CO2 22 22 - 32 mmol/L   Glucose, Bld 94 70 - 99 mg/dL   BUN 9 6 - 20 mg/dL   Creatinine, Ser 1.00 0.61 - 1.24 mg/dL   Calcium 9.6 8.9 - 10.3 mg/dL   Total Protein 8.1 6.5 - 8.1 g/dL   Albumin 4.8 3.5 - 5.0 g/dL   AST 39 15 - 41 U/L  ALT 30 0 - 44 U/L   Alkaline Phosphatase 62 38 - 126 U/L   Total Bilirubin 1.7 (H) 0.3 - 1.2 mg/dL   GFR calc non Af Amer >60 >60 mL/min   GFR calc Af Amer >60 >60 mL/min   Anion gap 14 5 - 15    Comment: Performed at [facility], [address] [place], [place] [address]  CBC     Status: None   Collection Time: 12/15/18  5:26 PM  Result Value Ref Range   WBC 9.4 4.0 - 10.5 K/uL   RBC 4.97 4.22 - 5.81 MIL/uL   Hemoglobin 14.7 13.0 - 17.0 g/dL   HCT 60.442.5 54.039.0 - 98.152.0 %   MCV 85.5 80.0 - 100.0 fL   MCH 29.6 26.0 - 34.0 pg   MCHC 34.6 30.0 - 36.0 g/dL   RDW 19.112.8 47.811.5 - 29.515.5 %   Platelets 261 150 - 400 K/uL   nRBC 0.0 0.0 - 0.2 %    Comment: Performed at [facility], [address] [place], [place] [address]   Ct Abdomen Pelvis W Contrast  Result Date:  12/15/2018 CLINICAL DATA:  Right lower quadrant abdominal pain starting this morning. EXAM: CT ABDOMEN AND PELVIS WITH CONTRAST TECHNIQUE: Multidetector CT imaging of the abdomen and pelvis was performed using the standard protocol following bolus administration of intravenous contrast. CONTRAST:  100mL OMNIPAQUE IOHEXOL 300 MG/ML  SOLN COMPARISON:  None. FINDINGS: Lower chest: Unremarkable Hepatobiliary: Unremarkable Pancreas: Unremarkable Spleen: Unremarkable Adrenals/Urinary Tract: Unremarkable Stomach/Bowel: There is abnormal wall thickening of the appendix up to 1.2 cm in diameter, with surrounding periappendiceal stranding, compatible with acute appendicitis. No extraluminal gas or abscess. Vascular/Lymphatic: Unremarkable Reproductive: Unremarkable Other: No supplemental non-categorized findings. Musculoskeletal: Unremarkable IMPRESSION: Acute nonruptured appendicitis. No abscess or extraluminal gas. Electronically Signed   By: Gaylyn RongWalter  Liebkemann M.D.   On: 12/15/2018 19:29    Pending Labs Unresulted Labs (From admission, onward)    Start     Ordered   12/15/18 1952  SARS Coronavirus 2 by RT PCR (hospital order, performed in [facility] hospital lab) Nasopharyngeal Nasopharyngeal Swab  ONCE - STAT,   STAT    Comments: Going to the or for an appendectomy   Question Answer Comment  Is this test for diagnosis or screening Screening   Symptomatic for COVID-19 as defined by CDC No   Hospitalized for COVID-19 No   Admitted to ICU for COVID-19 No   Previously tested for COVID-19 No   Resident in a congregate (group) care setting Unknown   Employed in healthcare setting Unknown      12/15/18 1951   Signed and Held  HIV Antibody (routine testing w rflx)  (HIV Antibody (Routine testing w reflex) panel)  Once,   R     Signed and Held   Signed and Held  CBC  Daily,   R     Signed and Held          Vitals/Pain Today's Vitals   12/15/18 1711 12/15/18 1714 12/15/18 2030 12/15/18 2127  BP:   (!) 171/93    Pulse:  94    Resp:  16    Temp:  99.5 F (37.5 C)    TempSrc:  Oral    SpO2:  100%    Weight:  104.3 kg    Height:  6\' 2"  (1.88 m)    PainSc: 3   0-No pain 0-No pain    Isolation Precautions No active isolations  Medications Medications  sodium chloride flush (NS) 0.9 % injection 3 mL (3 mLs Intravenous Not Given 12/15/18 1758)  0.9 %  sodium chloride infusion ( Intravenous New Bag/Given 12/15/18 2123)  iohexol (OMNIPAQUE) 300 MG/ML solution 100 mL (100 mLs Intravenous Contrast Given 12/15/18 1905)  cefTRIAXone (ROCEPHIN) 2 g in sodium chloride 0.9 % 100 mL IVPB (0 g Intravenous Stopped 12/15/18 2228)    And  metroNIDAZOLE (FLAGYL) IVPB 500 mg (0 mg Intravenous Stopped 12/15/18 2228)    Mobility walks Low fall risk   Focused Assessments   R Recommendations: See Admitting Provider Note  Report given to:   Additional Notes: patient A&O x 4; denies pain; opting for 72 hr IV tx vs surgery for inflamed appendix;Wife will be assigned visitor-Monique,RN

## 2018-12-15 NOTE — ED Notes (Signed)
Patient is being discharged from the Urgent [place] and sent to the Emergency Department via wheelchair by staff. Per Augusto Gamble, NP, patient is stable but in need of higher level of care due to right lower quadrant abdominal pain and low grade fever. Patient is aware and verbalizes understanding of plan of care.  Vitals:   12/15/18 1642  BP: (!) 160/83  Pulse: 62  Resp: 20  Temp: 100.2 F (37.9 C)  SpO2: 100%

## 2018-12-15 NOTE — ED Notes (Signed)
ED Provider at bedside. 

## 2018-12-15 NOTE — ED Triage Notes (Signed)
Pt reports RLQ pain since this morning, denies n/v/d. Pt a.o, nad noted

## 2018-12-15 NOTE — H&P (Signed)
CC: RLQ pain  Requesting provider: Jodi Geralds, PA-C  HPI: Zachary Mcgrath is an 33 y.o. male whom developed right lower quadrant abdominal pain that began today.  He reports this was the first time this is ever happened.  He denies any nausea or vomiting.  He denies any fever or chills.  He denies any radiation.  He flexes his leg and says that his belly does not hurt at all at this point.  He is here with his wife.  During my evaluation, he was up and intermittently pacing the room.  History reviewed. No pertinent past medical history.  History reviewed. No pertinent surgical history.  Family History  Problem Relation Age of Onset  . Healthy Mother     Social:  reports that he has been smoking cigarettes. He does not have any smokeless tobacco history on file. He reports current alcohol use. He reports that he does not use drugs.  Allergies: No Known Allergies  Medications: I have reviewed the patient's current medications.  Results for orders placed or performed during the hospital encounter of 12/15/18 (from the past 48 hour(s))  Urinalysis, Routine w reflex microscopic     Status: None   Collection Time: 12/15/18  5:12 PM  Result Value Ref Range   Color, Urine YELLOW YELLOW   APPearance CLEAR CLEAR   Specific Gravity, Urine 1.014 1.005 - 1.030   pH 6.0 5.0 - 8.0   Glucose, UA NEGATIVE NEGATIVE mg/dL   Hgb urine dipstick NEGATIVE NEGATIVE   Bilirubin Urine NEGATIVE NEGATIVE   Ketones, ur NEGATIVE NEGATIVE mg/dL   Protein, ur NEGATIVE NEGATIVE mg/dL   Nitrite NEGATIVE NEGATIVE   Leukocytes,Ua NEGATIVE NEGATIVE    Comment: Performed at [facility], [address], [place], [place] [address]  Lipase, blood     Status: None   Collection Time: 12/15/18  5:26 PM  Result Value Ref Range   Lipase 28 11 - 51 U/L    Comment: Performed at [facility], [address], [place], [place] [address]  Comprehensive metabolic panel     Status: Abnormal   Collection Time:  12/15/18  5:26 PM  Result Value Ref Range   Sodium 137 135 - 145 mmol/L   Potassium 4.2 3.5 - 5.1 mmol/L    Comment: SLIGHT HEMOLYSIS   Chloride 101 98 - 111 mmol/L   CO2 22 22 - 32 mmol/L   Glucose, Bld 94 70 - 99 mg/dL   BUN 9 6 - 20 mg/dL   Creatinine, Ser 9.70 0.61 - 1.24 mg/dL   Calcium 9.6 8.9 - 26.3 mg/dL   Total Protein 8.1 6.5 - 8.1 g/dL   Albumin 4.8 3.5 - 5.0 g/dL   AST 39 15 - 41 U/L   ALT 30 0 - 44 U/L   Alkaline Phosphatase 62 38 - 126 U/L   Total Bilirubin 1.7 (H) 0.3 - 1.2 mg/dL   GFR calc non Af Amer >60 >60 mL/min   GFR calc Af Amer >60 >60 mL/min   Anion gap 14 5 - 15    Comment: Performed at [facility], [address] [place], [place] [address]  CBC     Status: None   Collection Time: 12/15/18  5:26 PM  Result Value Ref Range   WBC 9.4 4.0 - 10.5 K/uL   RBC 4.97 4.22 - 5.81 MIL/uL   Hemoglobin 14.7 13.0 - 17.0 g/dL   HCT 50.2 77.4 - 12.8 %   MCV  85.5 80.0 - 100.0 fL   MCH 29.6 26.0 - 34.0 pg   MCHC 34.6 30.0 - 36.0 g/dL   RDW 16.1 09.6 - 04.5 %   Platelets 261 150 - 400 K/uL   nRBC 0.0 0.0 - 0.2 %    Comment: Performed at [facility], [address], [place], [place] [address]    Ct Abdomen Pelvis W Contrast  Result Date: 12/15/2018 CLINICAL DATA:  Right lower quadrant abdominal pain starting this morning. EXAM: CT ABDOMEN AND PELVIS WITH CONTRAST TECHNIQUE: Multidetector CT imaging of the abdomen and pelvis was performed using the standard protocol following bolus administration of intravenous contrast. CONTRAST:  OMNIPAQUE IOHEXOL 300 MG/ML  SOLN COMPARISON:  None. FINDINGS: Lower chest: Unremarkable Hepatobiliary: Unremarkable Pancreas: Unremarkable Spleen: Unremarkable Adrenals/Urinary Tract: Unremarkable Stomach/Bowel: There is abnormal wall thickening of the appendix up to 1.2 cm in diameter, with surrounding periappendiceal stranding, compatible with acute appendicitis. No extraluminal gas or abscess. Vascular/Lymphatic:  Unremarkable Reproductive: Unremarkable Other: No supplemental non-categorized findings. Musculoskeletal: Unremarkable IMPRESSION: Acute nonruptured appendicitis. No abscess or extraluminal gas. Electronically Signed   By: Gaylyn Rong M.D.   On: 12/15/2018 19:29    ROS - all of the below systems have been reviewed with the patient and positives are indicated with bold text General: chills, fever or night sweats Eyes: blurry vision or double vision ENT: epistaxis or sore throat Allergy/Immunology: itchy/watery eyes or nasal congestion Hematologic/Lymphatic: bleeding problems, blood clots or swollen lymph nodes Endocrine: temperature intolerance or unexpected weight changes Breast: new or changing breast lumps or nipple discharge Resp: cough, shortness of breath, or wheezing CV: chest pain or dyspnea on exertion GI: as per HPI GU: dysuria, trouble voiding, or hematuria MSK: joint pain or joint stiffness Neuro: TIA or stroke symptoms Derm: pruritus and skin lesion changes Psych: anxiety and depression  PE Blood pressure (!) 171/93, pulse 94, temperature 99.5 F (37.5 C), temperature source Oral, resp. rate 16, height  (1.88 m), weight 104.3 kg, SpO2 100 %. Constitutional: NAD; conversant; no deformities Eyes: Moist conjunctiva; no lid lag; anicteric; pupils equal and round Lungs: Normal respiratory effort CV: RRR GI: Abd soft, focally ttp in RLQ; no rebound MSK: Normal gait Psychiatric: Appropriate affect although appears nervous; alert and oriented x3 **PE limited by patient in my evaluation  Results for orders placed or performed during the hospital encounter of 12/15/18 (from the past 48 hour(s))  Urinalysis, Routine w reflex microscopic     Status: None   Collection Time: 12/15/18  5:12 PM  Result Value Ref Range   Color, Urine YELLOW YELLOW   APPearance CLEAR CLEAR   Specific Gravity, Urine 1.014 1.005 - 1.030   pH 6.0 5.0 - 8.0   Glucose, UA NEGATIVE NEGATIVE  mg/dL   Hgb urine dipstick NEGATIVE NEGATIVE   Bilirubin Urine NEGATIVE NEGATIVE   Ketones, ur NEGATIVE NEGATIVE mg/dL   Protein, ur NEGATIVE NEGATIVE mg/dL   Nitrite NEGATIVE NEGATIVE   Leukocytes,Ua NEGATIVE NEGATIVE    Comment: Performed at [facility], [address] [place], [place] [address]  Lipase, blood     Status: None   Collection Time: 12/15/18  5:26 PM  Result Value Ref Range   Lipase 28 11 - 51 U/L    Comment: Performed at [facility], [address] [place], [place] [address]  Comprehensive metabolic panel     Status: Abnormal   Collection Time: 12/15/18  5:26 PM  Result Value Ref Range  Sodium 137 135 - 145 mmol/L   Potassium 4.2 3.5 - 5.1 mmol/L    Comment: SLIGHT HEMOLYSIS   Chloride 101 98 - 111 mmol/L   CO2 22 22 - 32 mmol/L   Glucose, Bld 94 70 - 99 mg/dL   BUN 9 6 - 20 mg/dL   Creatinine, Ser 9.601.00 0.61 - 1.24 mg/dL   Calcium 9.6 8.9 - 45.410.3 mg/dL   Total Protein 8.1 6.5 - 8.1 g/dL   Albumin 4.8 3.5 - 5.0 g/dL   AST 39 15 - 41 U/L   ALT 30 0 - 44 U/L   Alkaline Phosphatase 62 38 - 126 U/L   Total Bilirubin 1.7 (H) 0.3 - 1.2 mg/dL   GFR calc non Af Amer >60 >60 mL/min   GFR calc Af Amer >60 >60 mL/min   Anion gap 14 5 - 15    Comment: Performed at [facility], [address] [place], [place] [address]  CBC     Status: None   Collection Time: 12/15/18  5:26 PM  Result Value Ref Range   WBC 9.4 4.0 - 10.5 K/uL   RBC 4.97 4.22 - 5.81 MIL/uL   Hemoglobin 14.7 13.0 - 17.0 g/dL   HCT 91.442.5 78.239.0 - 95.652.0 %   MCV 85.5 80.0 - 100.0 fL   MCH 29.6 26.0 - 34.0 pg   MCHC 34.6 30.0 - 36.0 g/dL   RDW 21.312.8 08.611.5 - 57.815.5 %   Platelets 261 150 - 400 K/uL   nRBC 0.0 0.0 - 0.2 %    Comment: Performed at [facility], [address] [place], [place] [address]    Ct Abdomen Pelvis W Contrast  Result Date: 12/15/2018 CLINICAL DATA:  Right lower quadrant abdominal pain starting this morning. EXAM: CT ABDOMEN AND PELVIS WITH CONTRAST  TECHNIQUE: Multidetector CT imaging of the abdomen and pelvis was performed using the standard protocol following bolus administration of intravenous contrast. CONTRAST:  100mL OMNIPAQUE IOHEXOL 300 MG/ML  SOLN COMPARISON:  None. FINDINGS: Lower chest: Unremarkable Hepatobiliary: Unremarkable Pancreas: Unremarkable Spleen: Unremarkable Adrenals/Urinary Tract: Unremarkable Stomach/Bowel: There is abnormal wall thickening of the appendix up to 1.2 cm in diameter, with surrounding periappendiceal stranding, compatible with acute appendicitis. No extraluminal gas or abscess. Vascular/Lymphatic: Unremarkable Reproductive: Unremarkable Other: No supplemental non-categorized findings. Musculoskeletal: Unremarkable IMPRESSION: Acute nonruptured appendicitis. No abscess or extraluminal gas. Electronically Signed   By: Gaylyn RongWalter  Liebkemann M.D.   On: 12/15/2018 19:29   A/P: Silvano Ruskntwan Moltz is an 33 y.o. male with acute appendicitis  -The anatomy and physiology of the GI tract was discussed at length and his wife with the patient. The pathophysiology of appendicitis was discussed at length as well. -I discussed options moving forward.  We discussed surgery for management of appendicitis -laparoscopic approaches to management of appendicitis.  He became quite nervous with this discussion and on multiple instances was asking about other options for treatment.  We discussed antibiotics for treatment-IV antibiotics.  We discussed high rates of recurrence over the course of the next 5 years-up to 40%.  We discussed that with antibiotic treatment alone, patients are admitted to the hospital and treated for at least 72 hours with IV antibiotics.  We discussed potential for worsening and still needing surgery.  -He stated he would not like to come into the hospital for surgery or IV antibiotics.  He said he would like to take antibiotics by mouth.  I discussed that this was not recommended and uncharted territory.  I discussed  potential for failure of this treatment strategy, perforation, abscess, sepsis, etc.  I discussed that if this was the route he would like to pursue, it would be [place].  He requested a prescription for oral antibiotics and stated he would be leaving.  I discussed this with the ED providers as well.  They will be facilitating these processes  Sharon Mt. Dema Severin, M.D. [facility]

## 2018-12-16 ENCOUNTER — Encounter (HOSPITAL_COMMUNITY): Admission: EM | Disposition: A | Discharge status: Home / Self Care | Payer: Self-pay | Source: Home / Self Care

## 2018-12-16 ENCOUNTER — Inpatient Hospital Stay (HOSPITAL_COMMUNITY): Payer: PRIVATE HEALTH INSURANCE | Admitting: Certified Registered Nurse Anesthetist

## 2018-12-16 ENCOUNTER — Encounter (HOSPITAL_COMMUNITY): Payer: Self-pay | Admitting: Surgery

## 2018-12-16 ENCOUNTER — Encounter (HOSPITAL_COMMUNITY): Payer: Self-pay

## 2018-12-16 HISTORY — PX: LAPAROSCOPIC APPENDECTOMY: SHX408

## 2018-12-16 LAB — SURGICAL PCR SCREEN
MRSA, PCR: NEGATIVE
Staphylococcus aureus: NEGATIVE

## 2018-12-16 LAB — CBC
HCT: 37.8 % — ABNORMAL LOW (ref 39.0–52.0)
Hemoglobin: 13.3 g/dL (ref 13.0–17.0)
MCH: 30.2 pg (ref 26.0–34.0)
MCHC: 35.2 g/dL (ref 30.0–36.0)
MCV: 85.9 fL (ref 80.0–100.0)
Platelets: 222 10*3/uL (ref 150–400)
RBC: 4.4 MIL/uL (ref 4.22–5.81)
RDW: 12.8 % (ref 11.5–15.5)
WBC: 9 10*3/uL (ref 4.0–10.5)
nRBC: 0 % (ref 0.0–0.2)

## 2018-12-16 SURGERY — APPENDECTOMY, LAPAROSCOPIC
Anesthesia: General

## 2018-12-16 MED ORDER — MIDAZOLAM HCL 2 MG/2ML IJ SOLN
INTRAMUSCULAR | Status: AC
  Filled 2018-12-16: qty 2

## 2018-12-16 MED ORDER — PROPOFOL 10 MG/ML IV BOLUS
INTRAVENOUS | Status: AC
  Filled 2018-12-16: qty 40

## 2018-12-16 MED ORDER — ROCURONIUM BROMIDE 10 MG/ML (PF) SYRINGE
PREFILLED_SYRINGE | INTRAVENOUS | Status: DC | PRN
  Administered 2018-12-16: 40 mg via INTRAVENOUS

## 2018-12-16 MED ORDER — PROPOFOL 10 MG/ML IV BOLUS
INTRAVENOUS | Status: DC | PRN
  Administered 2018-12-16: 200 mg via INTRAVENOUS

## 2018-12-16 MED ORDER — ONDANSETRON HCL 4 MG/2ML IJ SOLN
INTRAMUSCULAR | Status: DC | PRN
  Administered 2018-12-16: 4 mg via INTRAVENOUS

## 2018-12-16 MED ORDER — BUPIVACAINE-EPINEPHRINE 0.25% -1:200000 IJ SOLN
INTRAMUSCULAR | Status: DC | PRN
  Administered 2018-12-16: 7 mL

## 2018-12-16 MED ORDER — LIDOCAINE 2% (20 MG/ML) 5 ML SYRINGE
INTRAMUSCULAR | Status: AC
  Filled 2018-12-16: qty 5

## 2018-12-16 MED ORDER — FENTANYL CITRATE (PF) 250 MCG/5ML IJ SOLN
INTRAMUSCULAR | Status: AC
  Filled 2018-12-16: qty 5

## 2018-12-16 MED ORDER — DEXAMETHASONE SODIUM PHOSPHATE 10 MG/ML IJ SOLN
INTRAMUSCULAR | Status: DC | PRN
  Administered 2018-12-16: 5 mg via INTRAVENOUS

## 2018-12-16 MED ORDER — LIDOCAINE 2% (20 MG/ML) 5 ML SYRINGE
INTRAMUSCULAR | Status: DC | PRN
  Administered 2018-12-16: 100 mg via INTRAVENOUS

## 2018-12-16 MED ORDER — FENTANYL CITRATE (PF) 250 MCG/5ML IJ SOLN
INTRAMUSCULAR | Status: DC | PRN
  Administered 2018-12-16 (×2): 100 ug via INTRAVENOUS

## 2018-12-16 MED ORDER — BUPIVACAINE-EPINEPHRINE 0.25% -1:200000 IJ SOLN
10.0000 mL | Freq: Once | INTRAMUSCULAR | Status: DC
  Filled 2018-12-16: qty 10

## 2018-12-16 MED ORDER — SUGAMMADEX SODIUM 200 MG/2ML IV SOLN
INTRAVENOUS | Status: DC | PRN
  Administered 2018-12-16: 200 mg via INTRAVENOUS

## 2018-12-16 MED ORDER — DEXAMETHASONE SODIUM PHOSPHATE 10 MG/ML IJ SOLN
INTRAMUSCULAR | Status: AC
  Filled 2018-12-16: qty 1

## 2018-12-16 MED ORDER — KETOROLAC TROMETHAMINE 30 MG/ML IJ SOLN
INTRAMUSCULAR | Status: AC
  Filled 2018-12-16: qty 1

## 2018-12-16 MED ORDER — OXYCODONE HCL 5 MG PO TABS: 5.0000 mg | ORAL_TABLET | ORAL | Status: DC | PRN

## 2018-12-16 MED ORDER — BUPIVACAINE HCL (PF) 0.25 % IJ SOLN
INTRAMUSCULAR | Status: AC
  Filled 2018-12-16: qty 30

## 2018-12-16 MED ORDER — BUPIVACAINE-EPINEPHRINE 0.5% -1:200000 IJ SOLN
50.0000 mL | Freq: Once | INTRAMUSCULAR | Status: DC
  Filled 2018-12-16 (×2): qty 50

## 2018-12-16 MED ORDER — SUCCINYLCHOLINE CHLORIDE 200 MG/10ML IV SOSY
PREFILLED_SYRINGE | INTRAVENOUS | Status: DC | PRN
  Administered 2018-12-16: 140 mg via INTRAVENOUS

## 2018-12-16 MED ORDER — MIDAZOLAM HCL 2 MG/2ML IJ SOLN
INTRAMUSCULAR | Status: DC | PRN
  Administered 2018-12-16: 2 mg via INTRAVENOUS

## 2018-12-16 MED ORDER — ONDANSETRON HCL 4 MG/2ML IJ SOLN
INTRAMUSCULAR | Status: AC
  Filled 2018-12-16: qty 2

## 2018-12-16 MED ORDER — ROCURONIUM BROMIDE 10 MG/ML (PF) SYRINGE
PREFILLED_SYRINGE | INTRAVENOUS | Status: AC
  Filled 2018-12-16: qty 10

## 2018-12-16 MED ORDER — SUCCINYLCHOLINE CHLORIDE 200 MG/10ML IV SOSY
PREFILLED_SYRINGE | INTRAVENOUS | Status: AC
  Filled 2018-12-16: qty 10

## 2018-12-16 MED ORDER — KETOROLAC TROMETHAMINE 30 MG/ML IJ SOLN
INTRAMUSCULAR | Status: DC | PRN
  Administered 2018-12-16: 30 mg via INTRAVENOUS

## 2018-12-16 MED ORDER — OXYCODONE HCL 5 MG PO TABS: 5.0000 mg | ORAL_TABLET | Freq: Four times a day (QID) | ORAL | 0 refills | Status: AC | PRN

## 2018-12-16 SURGICAL SUPPLY — 43 items
APPLIER CLIP ROT 10 11.4 M/L (STAPLE)
BENZOIN TINCTURE PRP APPL 2/3 (GAUZE/BANDAGES/DRESSINGS) ×2 IMPLANT
BLADE CLIPPER SURG (BLADE) IMPLANT
CANISTER SUCT 3000ML PPV (MISCELLANEOUS) IMPLANT
CHLORAPREP W/TINT 26 (MISCELLANEOUS) ×2 IMPLANT
CLIP APPLIE ROT 10 11.4 M/L (STAPLE) IMPLANT
CLOSURE STERI-STRIP 1/4X4 (GAUZE/BANDAGES/DRESSINGS) ×2 IMPLANT
COVER SURGICAL LIGHT HANDLE (MISCELLANEOUS) ×2 IMPLANT
COVER WAND RF STERILE (DRAPES) ×2 IMPLANT
CUTTER FLEX LINEAR 45M (STAPLE) ×2 IMPLANT
DRSG TEGADERM 2-3/8X2-3/4 SM (GAUZE/BANDAGES/DRESSINGS) ×2 IMPLANT
DRSG TEGADERM 4X4.75 (GAUZE/BANDAGES/DRESSINGS) ×2 IMPLANT
ELECT REM PT RETURN 9FT ADLT (ELECTROSURGICAL) ×2
ELECTRODE REM PT RTRN 9FT ADLT (ELECTROSURGICAL) ×1 IMPLANT
ENDOLOOP SUT PDS II  0 18 (SUTURE)
ENDOLOOP SUT PDS II 0 18 (SUTURE) IMPLANT
GAUZE SPONGE 2X2 8PLY STRL LF (GAUZE/BANDAGES/DRESSINGS) ×1 IMPLANT
GLOVE BIO SURGEON STRL SZ7 (GLOVE) ×2 IMPLANT
GLOVE BIOGEL PI IND STRL 7.5 (GLOVE) ×1 IMPLANT
GLOVE BIOGEL PI INDICATOR 7.5 (GLOVE) ×1
GOWN STRL REUS W/ TWL LRG LVL3 (GOWN DISPOSABLE) ×2 IMPLANT
GOWN STRL REUS W/TWL LRG LVL3 (GOWN DISPOSABLE) ×2
KIT BASIN OR (CUSTOM PROCEDURE TRAY) ×2 IMPLANT
KIT TURNOVER KIT B (KITS) ×2 IMPLANT
NS IRRIG 1000ML POUR BTL (IV SOLUTION) IMPLANT
PAD ARMBOARD 7.5X6 YLW CONV (MISCELLANEOUS) ×4 IMPLANT
POUCH SPECIMEN RETRIEVAL 10MM (ENDOMECHANICALS) ×2 IMPLANT
RELOAD STAPLE TA45 3.5 REG BLU (ENDOMECHANICALS) ×2 IMPLANT
SCISSORS LAP 5X35 DISP (ENDOMECHANICALS) IMPLANT
SET IRRIG TUBING LAPAROSCOPIC (IRRIGATION / IRRIGATOR) IMPLANT
SET TUBE SMOKE EVAC HIGH FLOW (TUBING) ×2 IMPLANT
SHEARS HARMONIC ACE PLUS 36CM (ENDOMECHANICALS) ×2 IMPLANT
SLEEVE ENDOPATH XCEL 5M (ENDOMECHANICALS) ×2 IMPLANT
SPECIMEN JAR SMALL (MISCELLANEOUS) ×2 IMPLANT
SPONGE GAUZE 2X2 STER 10/PKG (GAUZE/BANDAGES/DRESSINGS) ×1
STRIP CLOSURE SKIN 1/2X4 (GAUZE/BANDAGES/DRESSINGS) ×2 IMPLANT
SUT MNCRL AB 4-0 PS2 18 (SUTURE) ×2 IMPLANT
TOWEL GREEN STERILE (TOWEL DISPOSABLE) ×2 IMPLANT
TOWEL GREEN STERILE FF (TOWEL DISPOSABLE) ×2 IMPLANT
TRAY LAPAROSCOPIC MC (CUSTOM PROCEDURE TRAY) ×2 IMPLANT
TROCAR XCEL BLUNT TIP 100MML (ENDOMECHANICALS) ×2 IMPLANT
TROCAR XCEL NON-BLD 5MMX100MML (ENDOMECHANICALS) ×2 IMPLANT
WATER STERILE IRR 1000ML POUR (IV SOLUTION) ×2 IMPLANT

## 2018-12-16 NOTE — Progress Notes (Signed)
   Subjective/Chief Complaint: Pt not having significant pain, but temp higher this AM.     Objective: Vital signs in last 24 hours: Temp:  [98.4 F (36.9 C)-100.2 F (37.9 C)] 98.4 F (36.9 C) (11/26 0606) Pulse Rate:  [62-94] 65 (11/26 0606) Resp:  [16-20] 18 (11/26 0606) BP: (137-171)/(83-93) 137/91 (11/26 0606) SpO2:  [99 %-100 %] 99 % (11/26 0606) Weight:  [104.3 kg-105.5 kg] 105.5 kg (11/25 2332) Last BM Date: 12/16/18  Intake/Output from previous day: 11/25 0701 - 11/26 0700 In: 489.8 [P.O.:240; I.V.:13.7; IV Piggyback:236.1] Out: -  Intake/Output this shift: No intake/output data recorded.  General appearance: alert, cooperative and no distress Resp: breathing comfortably GI: soft, non distended, mild tenderness RLQ Extremities: extremities normal, atraumatic, no cyanosis or edema  Lab Results:  Recent Labs    12/15/18 1726 12/16/18 0120  WBC 9.4 9.0  HGB 14.7 13.3  HCT 42.5 37.8*  PLT 261 222   BMET Recent Labs    12/15/18 1726  NA 137  K 4.2  CL 101  CO2 22  GLUCOSE 94  BUN 9  CREATININE 1.00  CALCIUM 9.6   PT/INR No results for input(s): LABPROT, INR in the last 72 hours. ABG No results for input(s): PHART, HCO3 in the last 72 hours.  Invalid input(s): PCO2, PO2  Studies/Results: Ct Abdomen Pelvis W Contrast  Result Date: 12/15/2018 CLINICAL DATA:  Right lower quadrant abdominal pain starting this morning. EXAM: CT ABDOMEN AND PELVIS WITH CONTRAST TECHNIQUE: Multidetector CT imaging of the abdomen and pelvis was performed using the standard protocol following bolus administration of intravenous contrast. CONTRAST:  138mL OMNIPAQUE IOHEXOL 300 MG/ML  SOLN COMPARISON:  None. FINDINGS: Lower chest: Unremarkable Hepatobiliary: Unremarkable Pancreas: Unremarkable Spleen: Unremarkable Adrenals/Urinary Tract: Unremarkable Stomach/Bowel: There is abnormal wall thickening of the appendix up to 1.2 cm in diameter, with surrounding periappendiceal  stranding, compatible with acute appendicitis. No extraluminal gas or abscess. Vascular/Lymphatic: Unremarkable Reproductive: Unremarkable Other: No supplemental non-categorized findings. Musculoskeletal: Unremarkable IMPRESSION: Acute nonruptured appendicitis. No abscess or extraluminal gas. Electronically Signed   By: Van Clines M.D.   On: 12/15/2018 19:29    Anti-infectives: Anti-infectives (From admission, onward)   Start     Dose/Rate Route Frequency Ordered Stop   12/15/18 2345  piperacillin-tazobactam (ZOSYN) IVPB 3.375 g     3.375 g 12.5 mL/hr over 240 Minutes Intravenous Every 8 hours 12/15/18 2330     12/15/18 1945  cefTRIAXone (ROCEPHIN) 2 g in sodium chloride 0.9 % 100 mL IVPB     2 g 200 mL/hr over 30 Minutes Intravenous  Once 12/15/18 1936 12/15/18 2228   12/15/18 1945  metroNIDAZOLE (FLAGYL) IVPB 500 mg     500 mg 100 mL/hr over 60 Minutes Intravenous  Once 12/15/18 1936 12/15/18 2228      Assessment/Plan: s/p * No surgery found * acute appendicitis  Pt initially desired non operative therapy.  Discussed natural course of appendicitis with risk of rupture, drain(s), and risk of prolonged recovery.  He is [place] with surgery at this point. Discussed the likelihood that he will be able to go home this evening.   NPO Obtain consent. Deferred questions about anesthetic drugs to anesthesia, who he will meet in pre op.   LOS: 1 day    Stark Klein 12/16/2018

## 2018-12-16 NOTE — TOC Transition Note (Signed)
Transition of Care [facility]) - CM/SW Discharge Note   Patient Details  Name: Zachary Mcgrath MRN: [id number] Date of Birth: 07-05-1985  [facility] CM/SW Contact:  Pollie Friar, RN Phone Number: 12/16/2018, 3:25 PM   Clinical Narrative:    Pt discharging home with self care. Pt has insurance, transportation home.   Final next level of care: Home/Self Care Barriers to Discharge: No Barriers Identified   Patient Goals and CMS Choice        Discharge Placement                       Discharge Plan and Services                                     Social Determinants of Health (SDOH) Interventions     Readmission Risk Interventions No flowsheet data found.

## 2018-12-16 NOTE — Anesthesia Preprocedure Evaluation (Addendum)
Anesthesia Evaluation  Patient identified by MRN, date of birth, ID band Patient awake    Reviewed: Allergy & Precautions, NPO status , Patient's Chart, lab work & pertinent test results  Airway Mallampati: II  TM Distance: >3 FB     Dental   Pulmonary Current Smoker,    breath sounds clear to auscultation       Cardiovascular negative cardio ROS   Rhythm:Regular Rate:Normal     Neuro/Psych    GI/Hepatic Neg liver ROS, History noted CG   Endo/Other  negative endocrine ROS  Renal/GU negative Renal ROS     Musculoskeletal   Abdominal   Peds  Hematology   Anesthesia Other Findings   Reproductive/Obstetrics                            Anesthesia Physical Anesthesia Plan  ASA: II and emergent  Anesthesia Plan: General   Post-op Pain Management:    Induction: Intravenous, Rapid sequence and Cricoid pressure planned  PONV Risk Score and Plan: 1 and 2 and Ondansetron, Dexamethasone and Midazolam  Airway Management Planned:   Additional Equipment:   Intra-op Plan:   Post-operative Plan: Extubation in OR  Informed Consent: I have reviewed the patients History and Physical, chart, labs and discussed the procedure including the risks, benefits and alternatives for the proposed anesthesia with the patient or authorized representative who has indicated his/her understanding and acceptance.     Dental advisory given  Plan Discussed with: CRNA and Anesthesiologist  Anesthesia Plan Comments:         Anesthesia Quick Evaluation

## 2018-12-16 NOTE — Op Note (Signed)
Appendectomy, Lap, Procedure Note  Indications: The patient presented with a history of right-sided abdominal pain. A CT scan revealed findings consistent with acute appendicitis.  Pre-operative Diagnosis: Acute appendicitis without mention of peritonitis  Post-operative Diagnosis: Same  Surgeon: Maia Petties   Assistants: none  Anesthesia: General endotracheal anesthesia  ASA Class: 2  Procedure Details  The patient was seen again in the Holding Room. The risks, benefits, complications, treatment options, and expected outcomes were discussed with the patient and/or family. The possibilities of reaction to medication, perforation of viscus, bleeding, recurrent infection, finding a normal appendix, the need for additional procedures, failure to diagnose a condition, and creating a complication requiring transfusion or operation were discussed. There was concurrence with the proposed plan and informed consent was obtained. The site of surgery was properly noted. The patient was taken to Operating Room, identified as Chiropodist and the procedure verified as Appendectomy. A Time Out was held and the above information confirmed.  The patient was placed in the supine position and general anesthesia was induced.  The abdomen was prepped and draped in a sterile fashion. A one centimeter infraumbilical incision was made.  Dissection was carried down to the fascia bluntly.  The fascia was incised vertically.  We entered the peritoneal cavity bluntly.  A pursestring suture was passed around the incision with a 0 Vicryl.  The Hasson cannula was introduced into the abdomen and the tails of the suture were used to hold the Hasson in place.   The pneumoperitoneum was then established maintaining a maximum pressure of 15 mmHg.  Additional 5 mm cannulas then placed in the left lower quadrant of the abdomen and the right upper quadrant under direct visualization. A careful evaluation of the entire abdomen  was carried out. The patient was placed in Trendelenburg and left lateral decubitus position.  The scope was moved to the right upper quadrant port site. The cecum was mobilized medially.  The appendix was inflamed and distended at its tip with some adherent omentum.  There was no sign of perforation or abscess.  The appendix was carefully dissected. The appendix was skeletonized with the harmonic scalpel.   The appendix was divided at its base using an endo-GIA stapler. Minimal appendiceal stump was left in place. There was no evidence of bleeding, leakage, or complication after division of the appendix. Irrigation was also performed and irrigate suctioned from the abdomen as well.  The umbilical port site was closed with the purse string suture. There was no residual palpable fascial defect.  The trocar site skin wounds were closed with 4-0 Monocryl.  Instrument, sponge, and needle counts were correct at the conclusion of the case.   Findings: The appendix was found to be inflamed. There were not signs of necrosis.  There was not perforation. There was not abscess formation.  Estimated Blood Loss:  Minimal         Drains: none         Specimens: appendix         Complications:  None; patient tolerated the procedure well.         Disposition: PACU - hemodynamically stable.         Condition: stable  Imogene Burn. Georgette Dover, MD, [facility]  General/ Trauma Surgery   12/16/2018 1:44 PM

## 2018-12-16 NOTE — Discharge Summary (Signed)
Physician Discharge Summary  Patient ID: Zachary Mcgrath MRN: [id number] DOB/AGE: 31-28-1987 31 y.o.  Admit date: 12/15/2018 Discharge date: 12/16/2018  Admission Diagnoses:  Acute appendicitis  Discharge Diagnoses: same Active Problems:   Acute appendicitis   Discharged Condition: good  Hospital Course: Acute appendicitis - lap appy on 11/26.  Did well - no sign of perforation.  Ready for discharge a few hours after surgery.   Treatments: surgery: lap appy  Discharge Exam: Blood pressure (!) 157/88, pulse 72, temperature 98.4 F (36.9 C), temperature source Oral, resp. rate 16, height 6\' 1"  (1.854 m), weight 105.5 kg, SpO2 100 %. WDWN in NAD, Abd - soft, incisional tenderness, dressings c/d/i  Disposition: Discharge disposition: 01-Home or Self Care       Discharge Instructions    Call MD for:  persistant nausea and vomiting   Complete by: As directed    Call MD for:  redness, tenderness, or signs of infection (pain, swelling, redness, odor or green/yellow discharge around incision site)   Complete by: As directed    Call MD for:  severe uncontrolled pain   Complete by: As directed    Call MD for:  temperature >100.4   Complete by: As directed    Diet general   Complete by: As directed    Driving Restrictions   Complete by: As directed    Do not drive while taking pain medications   Increase activity slowly   Complete by: As directed    May shower / Bathe   Complete by: As directed      Allergies as of 12/16/2018   No Known Allergies     Medication List    TAKE these medications   naproxen 500 MG tablet Commonly known as: NAPROSYN Take 1 tablet (500 mg total) by mouth 2 (two) times daily with a meal.   oxyCODONE 5 MG immediate release tablet Commonly known as: Oxy IR/ROXICODONE Take 1 tablet (5 mg total) by mouth every 6 (six) hours as needed for severe pain.   tiZANidine 4 MG tablet Commonly known as: Zanaflex Take 1-2 tablets (4-8 mg total) by  mouth every 6 (six) hours as needed for muscle spasms.      Follow-up [facility], [place]. Schedule an appointment as soon as possible for a visit in 3 weeks.   Specialty: General Surgery Contact information: [address] [place] [phone]          Signed: Maia Petties 12/16/2018, 6:43 PM

## 2018-12-16 NOTE — Transfer of Care (Signed)
Immediate Anesthesia Transfer of Care Note  Patient: Zachary Mcgrath  Procedure(s) Performed: APPENDECTOMY LAPAROSCOPIC (N/A )  Patient Location: PACU  Anesthesia Type:General  Level of Consciousness: awake, alert  and oriented  Airway & Oxygen Therapy: Patient Spontanous Breathing and Patient connected to nasal cannula oxygen  Post-op Assessment: Report given to RN and Post -op Vital signs reviewed and stable  Post vital signs: Reviewed and stable  Last Vitals:  Vitals Value Taken Time  BP    Temp    Pulse    Resp    SpO2      Last Pain:  Vitals:   12/16/18 1340  TempSrc:   PainSc: (P) 0-No pain      Patients Stated Pain Goal: 3 (16/38/45 3646)  Complications: No apparent anesthesia complications

## 2018-12-16 NOTE — Discharge Instructions (Signed)
[facility] ______[facility] °LAPAROSCOPIC SURGERY: POST OP INSTRUCTIONS °Always review your discharge instruction sheet given to you by the facility where your surgery was performed. °IF YOU HAVE DISABILITY OR FAMILY LEAVE FORMS, YOU MUST BRING THEM TO THE OFFICE FOR PROCESSING.   °DO NOT GIVE THEM TO YOUR DOCTOR. ° °1. A prescription for pain medication may be given to you upon discharge.  Take your pain medication as prescribed, if needed.  If narcotic pain medicine is not needed, then you may take acetaminophen (Tylenol) or ibuprofen (Advil) as needed. °2. Take your usually prescribed medications unless otherwise directed. °3. If you need a refill on your pain medication, please contact your pharmacy.  They will contact our office to request authorization. Prescriptions will not be filled after 5pm or on week-ends. °4. You should follow a light diet the first few days after arrival home, such as soup and crackers, etc.  Be sure to include lots of fluids daily. °5. Most patients will experience some swelling and bruising in the area of the incisions.  Ice packs will help.  Swelling and bruising can take several days to resolve.  °6. It is common to experience some constipation if taking pain medication after surgery.  Increasing fluid intake and taking a stool softener (such as Colace) will usually help or prevent this problem from occurring.  A mild laxative (Milk of Magnesia or Miralax) should be taken according to package instructions if there are no bowel movements after 48 hours. °7. Unless discharge instructions indicate otherwise, you may remove your bandages 24-48 hours after surgery, and you may shower at that time.  You may have steri-strips (small skin tapes) in place directly over the incision.  These strips should be left on the skin for 7-10 days.  If your surgeon used skin glue on the incision, you may shower in 24 hours.  The glue will flake off over the next 2-3 weeks.  Any sutures or  staples will be removed at the office during your follow-up visit. °8. ACTIVITIES:  You may resume regular (light) daily activities beginning the next day--such as daily self-care, walking, climbing stairs--gradually increasing activities as tolerated.  You may have sexual intercourse when it is comfortable.  Refrain from any heavy lifting or straining until approved by your doctor. °a. You may drive when you are no longer taking prescription pain medication, you can comfortably wear a seatbelt, and you can safely maneuver your car and apply brakes. °b. RETURN TO WORK:  __________________________________________________________ °9. You should see your doctor in the office for a follow-up appointment approximately 2-3 weeks after your surgery.  Make sure that you call for this appointment within a day or two after you arrive home to insure a convenient appointment time. °10. OTHER INSTRUCTIONS: __________________________________________________________________________________________________________________________ __________________________________________________________________________________________________________________________ °WHEN TO CALL YOUR DOCTOR: °1. Fever over 101.0 °2. Inability to urinate °3. Continued bleeding from incision. °4. Increased pain, redness, or drainage from the incision. °5. Increasing abdominal pain ° °The clinic staff is available to answer your questions during regular business hours.  Please don’t hesitate to call and ask to speak to one of the nurses for clinical concerns.  If you have a medical emergency, go to the nearest emergency room or call 911.  A surgeon from [facility] is always on call at the hospital. °[address], [address], [place], [place]  [address] ? [address] [address], [place], [place]   [address] °[phone] ? [phone] ? FAX [phone] °Web site:   [email or web address] °

## 2018-12-16 NOTE — Anesthesia Procedure Notes (Signed)
Procedure Name: Intubation Date/Time: 12/16/2018 12:59 PM Performed by: Kathryne Hitch, CRNA Pre-anesthesia Checklist: Patient identified, Emergency Drugs available, Suction available and Patient being monitored Patient Re-evaluated:Patient Re-evaluated prior to induction Oxygen Delivery Method: Circle system utilized Preoxygenation: Pre-oxygenation with 100% oxygen Induction Type: IV induction, Rapid sequence and Cricoid Pressure applied Laryngoscope Size: Miller and 3 Grade View: Grade I Tube type: Oral Tube size: 7.5 mm Number of attempts: 1 Airway Equipment and Method: Stylet and Oral airway Placement Confirmation: ETT inserted through vocal cords under direct vision,  positive ETCO2 and breath sounds checked- equal and bilateral Secured at: 23 cm Tube secured with: Tape Dental Injury: Teeth and Oropharynx as per pre-operative assessment

## 2018-12-16 NOTE — Anesthesia Postprocedure Evaluation (Signed)
Anesthesia Post Note  Patient: Zachary Mcgrath  Procedure(s) Performed: APPENDECTOMY LAPAROSCOPIC (N/A )     Patient location during evaluation: PACU Anesthesia Type: General Level of consciousness: awake Pain management: pain level controlled Vital Signs Assessment: post-procedure vital signs reviewed and stable Respiratory status: spontaneous breathing Cardiovascular status: stable Anesthetic complications: no    Last Vitals:  Vitals:   12/16/18 1355 12/16/18 1426  BP: (!) 149/88 (!) 172/94  Pulse: 67 (!) 53  Resp: 16 16  Temp: 36.8 C   SpO2: 100% 100%    Last Pain:  Vitals:   12/16/18 1340  TempSrc:   PainSc: 0-No pain                 Gabrien Mentink

## 2018-12-16 NOTE — Progress Notes (Signed)
Patient discharged to home with instructions, ambulated, voided and tolerated his diet well with no complaints.

## 2018-12-17 ENCOUNTER — Encounter (HOSPITAL_COMMUNITY): Payer: Self-pay | Admitting: Surgery

## 2018-12-20 LAB — SURGICAL PATHOLOGY

## 2020-06-20 IMAGING — CT CT ABD-PELV W/ CM
2 of 4 series · 17 of 46 positions shown, 19 images · IV contrast (APPLIED)
Comparison: None.

CLINICAL DATA: Right lower quadrant abdominal pain starting this
morning.

EXAM:
CT ABDOMEN AND PELVIS WITH CONTRAST
TECHNIQUE: Multidetector CT imaging of the abdomen and pelvis was performed
using the standard protocol following bolus administration of
intravenous contrast.
CONTRAST:  100mL OMNIPAQUE IOHEXOL 300 MG/ML  SOLN

[Series 3: abd/ pelvis 5.0 i30f 2 · axial · 0.97mm/px · z∈[+974,+1454]mm · 14 of 106 slices shown, 16 images]
[im 5/106  soft-tissue]
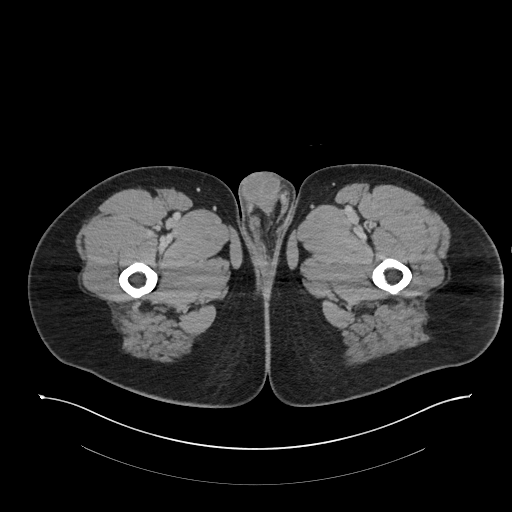
[im 5/106  bone]
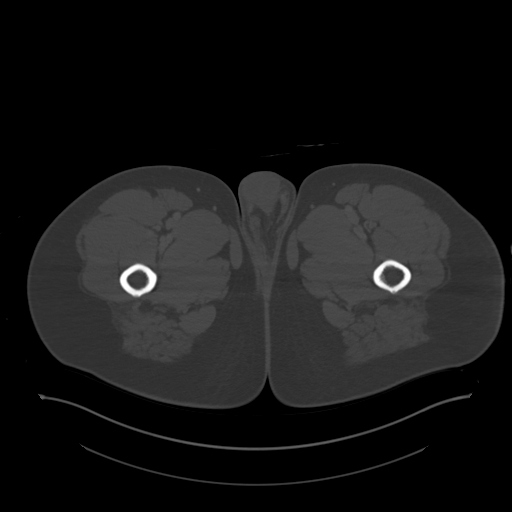
[im 14/106  soft-tissue]
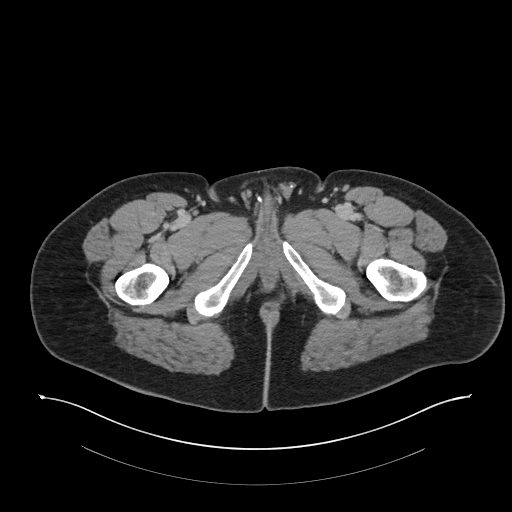
[im 19/106  soft-tissue]
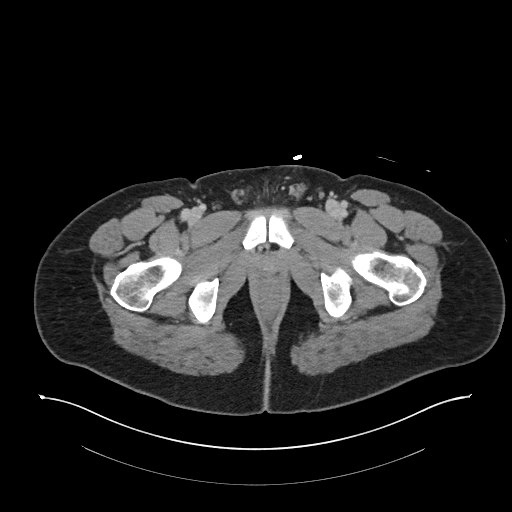
[im 28/106  soft-tissue]
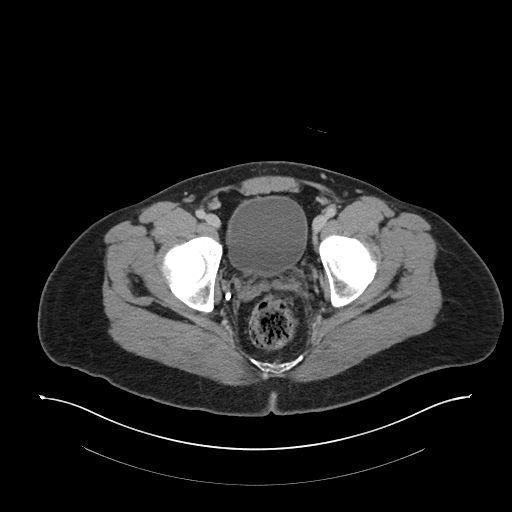
[im 37/106  soft-tissue]
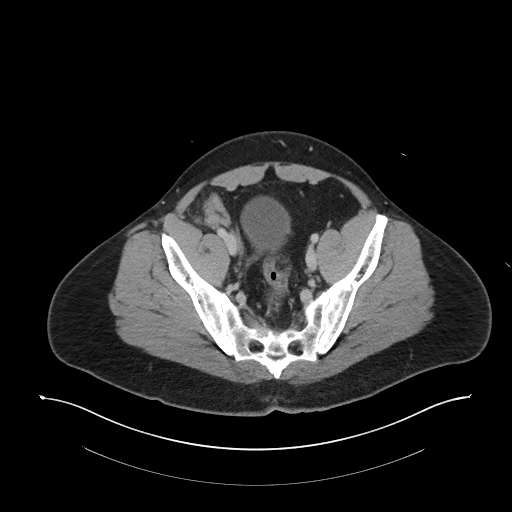
[im 42/106  soft-tissue]
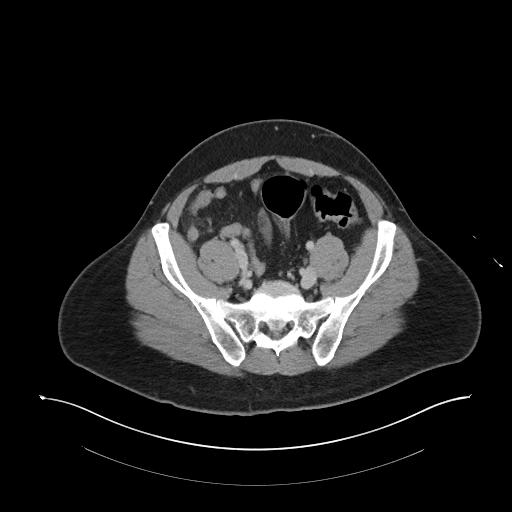
[im 51/106  soft-tissue]
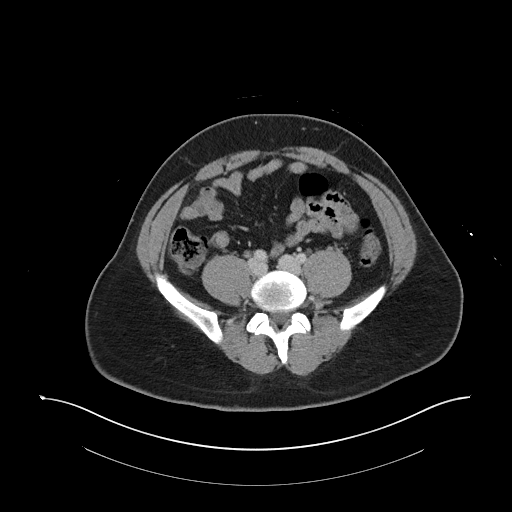
[im 55/106  soft-tissue]
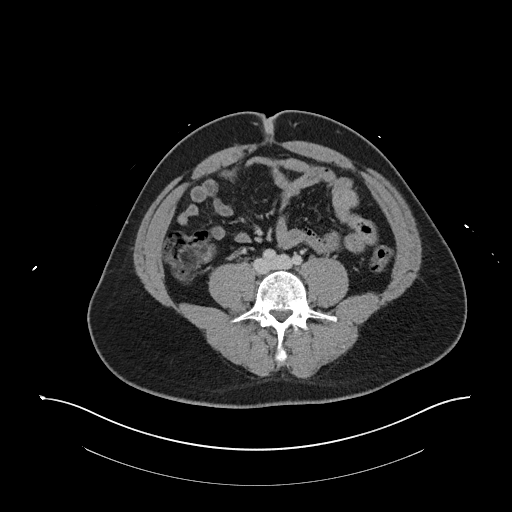
[im 64/106  soft-tissue]
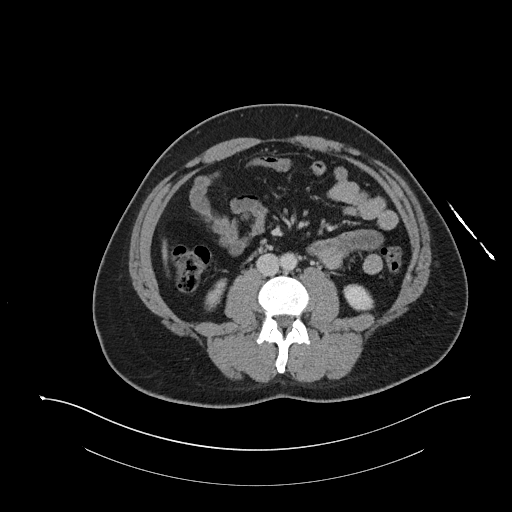
[im 64/106  bone]
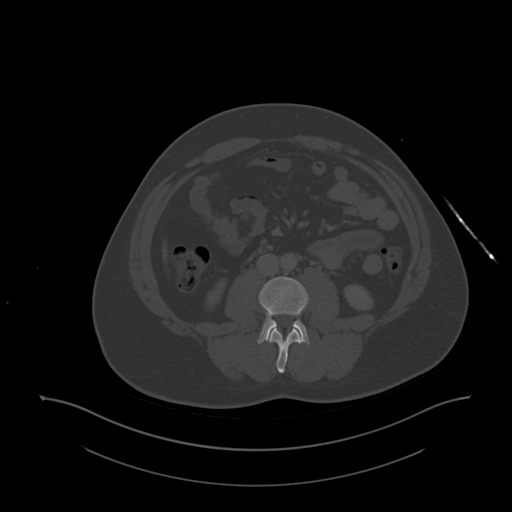
[im 69/106  soft-tissue]
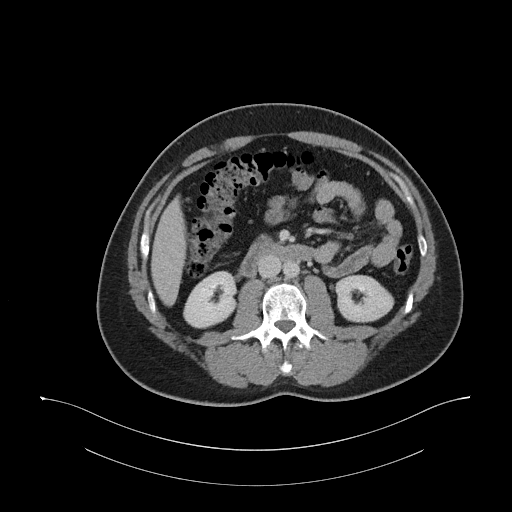
[im 78/106  soft-tissue]
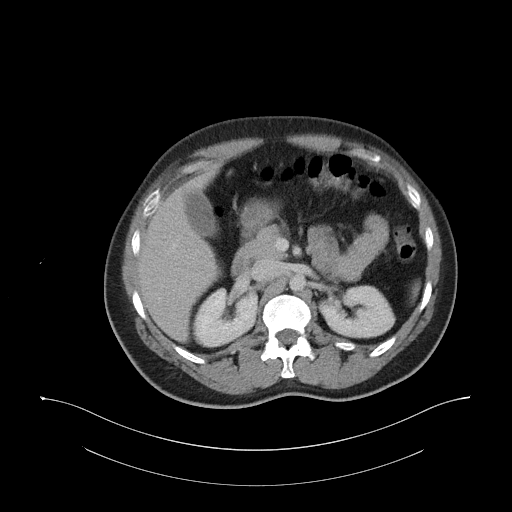
[im 87/106  soft-tissue]
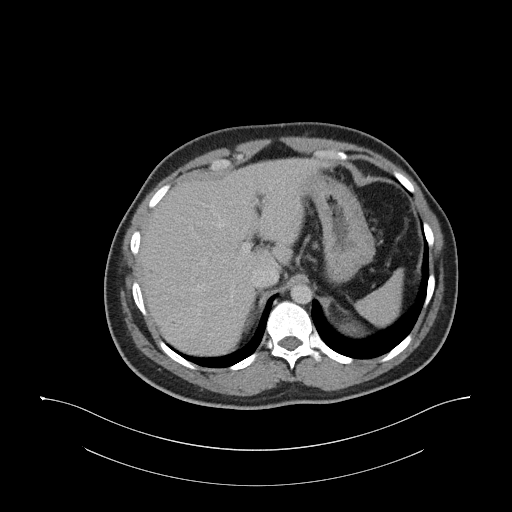
[im 92/106  soft-tissue]
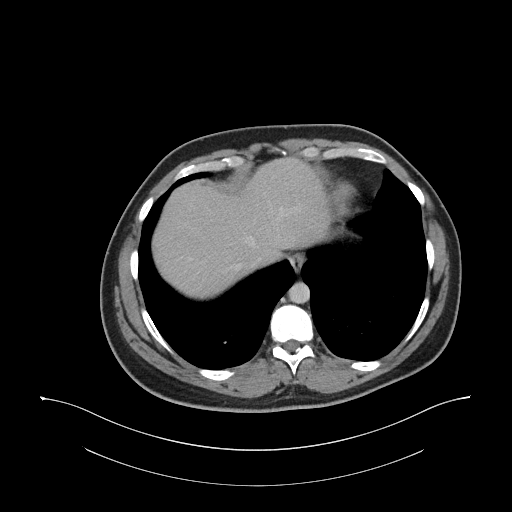
[im 101/106  soft-tissue]
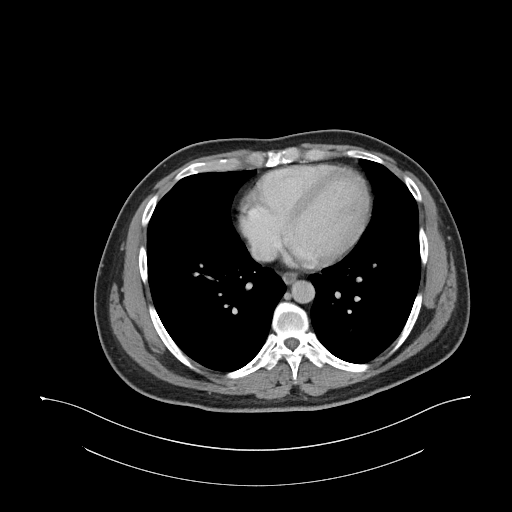

[Series 6: coronal soft tissue · coronal · 1.03mm/px · 3 of 119 slices shown]
[im 40/119  soft-tissue]
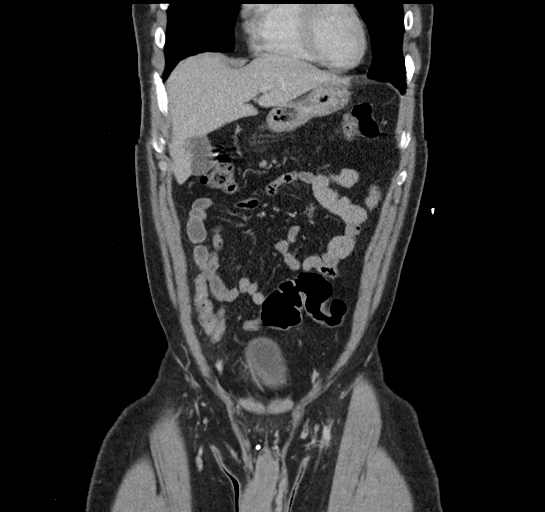
[im 53/119  soft-tissue]
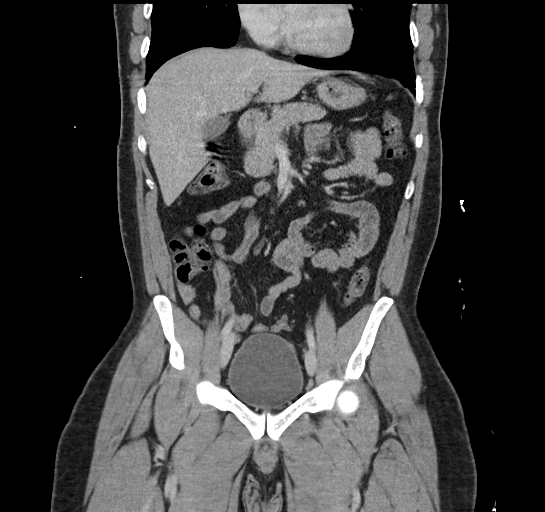
[im 66/119  soft-tissue]
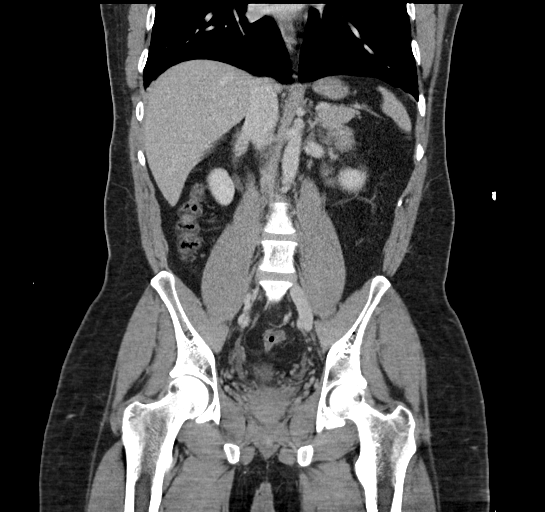

[17 of 46 positions shown; findings below may reference images not displayed]

FINDINGS: Lower chest: Unremarkable

Hepatobiliary: Unremarkable

Pancreas: Unremarkable

Spleen: Unremarkable

Adrenals/Urinary Tract: Unremarkable

Stomach/Bowel: There is abnormal wall thickening of the appendix up
to 1.2 cm in diameter, with surrounding periappendiceal stranding,
compatible with acute appendicitis. No extraluminal gas or abscess.

Vascular/Lymphatic: Unremarkable

Reproductive: Unremarkable

Other: No supplemental non-categorized findings.

Musculoskeletal: Unremarkable
IMPRESSION: Acute nonruptured appendicitis. No abscess or extraluminal gas.
